# Patient Record
Sex: Female | Born: 1980 | Race: White | Hispanic: No | Marital: Married | Smoking: Never smoker
Health system: Southern US, Community
[De-identification: ages and names within clinical notes are randomized; demographics above are authoritative.]

## PROBLEM LIST (undated history)

## (undated) DIAGNOSIS — C539 Malignant neoplasm of cervix uteri, unspecified: Secondary | ICD-10-CM

## (undated) DIAGNOSIS — F32A Depression, unspecified: Secondary | ICD-10-CM

## (undated) DIAGNOSIS — R51 Headache: Secondary | ICD-10-CM

## (undated) DIAGNOSIS — F329 Major depressive disorder, single episode, unspecified: Secondary | ICD-10-CM

## (undated) DIAGNOSIS — K5904 Chronic idiopathic constipation: Secondary | ICD-10-CM

## (undated) DIAGNOSIS — O00109 Unspecified tubal pregnancy without intrauterine pregnancy: Secondary | ICD-10-CM

## (undated) DIAGNOSIS — C4490 Unspecified malignant neoplasm of skin, unspecified: Secondary | ICD-10-CM

## (undated) DIAGNOSIS — N200 Calculus of kidney: Secondary | ICD-10-CM

## (undated) DIAGNOSIS — R519 Headache, unspecified: Secondary | ICD-10-CM

## (undated) DIAGNOSIS — Z86018 Personal history of other benign neoplasm: Secondary | ICD-10-CM

## (undated) HISTORY — DX: Calculus of kidney: N20.0

## (undated) HISTORY — PX: SKIN CANCER EXCISION: SHX779

## (undated) HISTORY — DX: Major depressive disorder, single episode, unspecified: F32.9

## (undated) HISTORY — PX: TUBAL LIGATION: SHX77

## (undated) HISTORY — DX: Depression, unspecified: F32.A

## (undated) HISTORY — DX: Headache: R51

## (undated) HISTORY — DX: Chronic idiopathic constipation: K59.04

## (undated) HISTORY — DX: Unspecified tubal pregnancy without intrauterine pregnancy: O00.109

## (undated) HISTORY — DX: Unspecified malignant neoplasm of skin, unspecified: C44.90

## (undated) HISTORY — PX: OTHER SURGICAL HISTORY: SHX169

## (undated) HISTORY — DX: Headache, unspecified: R51.9

---
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
## 2005-10-01 ENCOUNTER — Ambulatory Visit: Payer: Self-pay | Admitting: Neurology

## 2006-03-19 ENCOUNTER — Ambulatory Visit: Payer: Self-pay

## 2006-03-20 ENCOUNTER — Observation Stay: Payer: Self-pay | Admitting: Obstetrics and Gynecology

## 2006-03-31 ENCOUNTER — Ambulatory Visit: Payer: Self-pay | Admitting: Obstetrics and Gynecology

## 2006-04-15 ENCOUNTER — Observation Stay: Payer: Self-pay

## 2006-04-16 ENCOUNTER — Observation Stay: Payer: Self-pay | Admitting: Obstetrics and Gynecology

## 2006-04-16 ENCOUNTER — Ambulatory Visit: Payer: Self-pay | Admitting: Obstetrics and Gynecology

## 2006-04-18 ENCOUNTER — Observation Stay: Payer: Self-pay | Admitting: Obstetrics and Gynecology

## 2006-04-30 ENCOUNTER — Inpatient Hospital Stay: Payer: Self-pay

## 2006-05-05 ENCOUNTER — Inpatient Hospital Stay: Payer: Self-pay

## 2006-11-29 ENCOUNTER — Emergency Department: Payer: Self-pay | Admitting: Emergency Medicine

## 2006-12-01 ENCOUNTER — Ambulatory Visit: Payer: Self-pay | Admitting: Gastroenterology

## 2008-11-29 ENCOUNTER — Ambulatory Visit: Payer: Self-pay

## 2009-06-05 ENCOUNTER — Inpatient Hospital Stay: Payer: Self-pay | Admitting: Internal Medicine

## 2009-06-05 ENCOUNTER — Ambulatory Visit: Payer: Self-pay | Admitting: Internal Medicine

## 2009-06-07 ENCOUNTER — Inpatient Hospital Stay: Payer: Self-pay | Admitting: Psychiatry

## 2010-04-08 ENCOUNTER — Other Ambulatory Visit: Payer: Self-pay

## 2010-04-09 ENCOUNTER — Ambulatory Visit: Payer: Self-pay | Admitting: Obstetrics and Gynecology

## 2010-04-14 LAB — PATHOLOGY REPORT

## 2010-05-11 DIAGNOSIS — C539 Malignant neoplasm of cervix uteri, unspecified: Secondary | ICD-10-CM

## 2010-05-11 HISTORY — DX: Malignant neoplasm of cervix uteri, unspecified: C53.9

## 2012-12-21 ENCOUNTER — Emergency Department: Payer: Self-pay | Admitting: Emergency Medicine

## 2012-12-21 LAB — URINALYSIS, COMPLETE
Bacteria: NONE SEEN
Glucose,UR: NEGATIVE mg/dL (ref 0–75)
Leukocyte Esterase: NEGATIVE
Nitrite: NEGATIVE
Protein: NEGATIVE
RBC,UR: 2 /HPF (ref 0–5)
Squamous Epithelial: 5
WBC UR: 1 /HPF (ref 0–5)

## 2013-01-17 ENCOUNTER — Ambulatory Visit: Payer: Self-pay | Admitting: Family Medicine

## 2013-05-11 HISTORY — PX: AUGMENTATION MAMMAPLASTY: SUR837

## 2013-06-19 ENCOUNTER — Emergency Department: Payer: Self-pay | Admitting: Emergency Medicine

## 2013-06-19 LAB — URINALYSIS, COMPLETE
Bacteria: NONE SEEN
Bilirubin,UR: NEGATIVE
GLUCOSE, UR: NEGATIVE mg/dL (ref 0–75)
Ketone: NEGATIVE
Leukocyte Esterase: NEGATIVE
Nitrite: NEGATIVE
Ph: 6 (ref 4.5–8.0)
Protein: NEGATIVE
RBC,UR: NONE SEEN /HPF (ref 0–5)
Specific Gravity: 1.003 (ref 1.003–1.030)
WBC UR: NONE SEEN /HPF (ref 0–5)

## 2013-12-19 DIAGNOSIS — Z86018 Personal history of other benign neoplasm: Secondary | ICD-10-CM

## 2013-12-19 HISTORY — DX: Personal history of other benign neoplasm: Z86.018

## 2014-07-19 LAB — HM PAP SMEAR: HM Pap smear: NEGATIVE

## 2014-11-01 ENCOUNTER — Encounter: Payer: Self-pay | Admitting: Family Medicine

## 2014-11-01 ENCOUNTER — Ambulatory Visit (INDEPENDENT_AMBULATORY_CARE_PROVIDER_SITE_OTHER): Payer: Managed Care, Other (non HMO) | Admitting: Family Medicine

## 2014-11-01 VITALS — BP 131/96 | HR 71 | Temp 97.8°F | Ht 66.0 in | Wt 121.6 lb

## 2014-11-01 DIAGNOSIS — R42 Dizziness and giddiness: Secondary | ICD-10-CM | POA: Diagnosis not present

## 2014-11-01 DIAGNOSIS — R58 Hemorrhage, not elsewhere classified: Secondary | ICD-10-CM | POA: Diagnosis not present

## 2014-11-01 DIAGNOSIS — R634 Abnormal weight loss: Secondary | ICD-10-CM

## 2014-11-01 LAB — CBC WITH DIFFERENTIAL/PLATELET
HEMATOCRIT: 42 % (ref 34.0–46.6)
Hemoglobin: 14.8 g/dL (ref 11.1–15.9)
LYMPHS ABS: 1.7 10*3/uL (ref 0.7–3.1)
Lymphs: 26 %
MCH: 31.5 pg (ref 26.6–33.0)
MCHC: 35.2 g/dL (ref 31.5–35.7)
MCV: 89 fL (ref 79–97)
MID (Absolute): 0.6 10*3/uL (ref 0.1–1.6)
MID: 9 %
Neutrophils Absolute: 4.1 10*3/uL (ref 1.4–7.0)
Neutrophils: 65 %
Platelets: 296 10*3/uL (ref 150–379)
RBC: 4.7 x10E6/uL (ref 3.77–5.28)
RDW: 13.2 % (ref 12.3–15.4)
WBC: 6.4 10*3/uL (ref 3.4–10.8)

## 2014-11-01 NOTE — Assessment & Plan Note (Signed)
Concerning for possible hyperthyroid vs anxiety. Continue to monitor. Await lab results.

## 2014-11-01 NOTE — Progress Notes (Signed)
BP 131/96 mmHg  Pulse 71  Temp(Src) 97.8 F (36.6 C)  Ht 5\' 6"  (1.676 m)  Wt 121 lb 9.6 oz (55.157 kg)  BMI 19.64 kg/m2  SpO2 100%  LMP 10/22/2014 (Exact Date)   Subjective:    Patient ID: Regina [name], female    DOB: 07/21/1980, 34 y.o.   MRN: [id number]  HPI: Regina [name] is a 34 y.o. female who presents today not feeling like herself. She notes that she is just not feeling well and isn't sure what is going on.   Chief Complaint  Patient presents with  . Dizziness    Started on Sunday morning  . Headache    Started on Saturday, not a headache but stabbing pains  . Menstrual Problem    Has been on her cycle since the 13th  . Rectal Bleeding    Has been going on a couple of months   Sunday bad headache jabbing in the L side of her head. Has never had headaches on the L side previously. Has a history of migraines, but they don't feel like her normal migraine. Had her blood pressure taken for work and it was 118/40 on Monday and yesterday it was 150/80  DIZZINESS Duration: days Description of symptoms: lightheaded and room spinning with a lot of pressure in her head and exhausted Duration of episode: constant Dizziness frequency: no history of the same Provoking factors: none Aggravating factors:  none Triggered by rolling over in bed: no Triggered by bending over: no Aggravated by head movement: no Aggravated by exertion, coughing, loud noises: no Recent head injury: no Recent or current viral symptoms: no History of vasovagal episodes: no Nausea: yes Vomiting: no Tinnitus: no Hearing loss: no Aural fullness: no Headache: yes Photophobia/phonophobia: no Unsteady gait: no Postural instability: yes Diplopia, dysarthria, dysphagia or weakness: no Related to exertion: no Pallor: no Diaphoresis: no Dyspnea: no Chest pain: no   Has lost 27 pounds, 10lbs without effort. Has been under a lot of stress. Has also been losing hair.   ABNORMAL MENSTRUAL  PERIODS- had an IUD placed in March, has been light, has been 10 days on her period, no chance she could be pregnant Duration: 10 days Average interval between menses: 28-31 days Length of menses:  Flow: light Dysmenorrhea: yes Intermenstrual bleeding:no Postcoital bleeding: no Contraception: tubal ligation and IUD Sexual activity: In a Monogamous Relationship History of sexually transmitted diseases: no History GYN procedures: yes Abnormal pap smears: no   Dyspareunia: no Vaginal discharge:no Abdominal pain: no Galactorrhea: no Hirsuitism: no Frequent bruising/mucosal bleeding: no Double vision:no Hot flashes: no   She also notes that her stools have been very hard since she stopped her linzess and she has been having some rectal bleeding. Has not been using any stool softeners.   Relevant past medical, surgical, family and social history reviewed and updated as indicated. Interim medical history since our last visit reviewed. Allergies and medications reviewed and updated.  Review of Systems  Constitutional: Positive for fatigue and unexpected weight change. Negative for fever, chills, diaphoresis, activity change and appetite change.  Respiratory: Negative for apnea, cough, choking, chest tightness, shortness of breath, wheezing and stridor.   Cardiovascular: Negative for chest pain, palpitations and leg swelling.  Gastrointestinal: Positive for constipation and anal bleeding. Negative for nausea, vomiting, abdominal pain, diarrhea, blood in stool, abdominal distention and rectal pain.  Psychiatric/Behavioral: Positive for sleep disturbance and decreased concentration. Negative for suicidal ideas, hallucinations, behavioral problems, confusion, self-injury, dysphoric mood  and agitation. The patient is nervous/anxious. The patient is not hyperactive.     Per HPI unless specifically indicated above     Objective:    BP 131/96 mmHg  Pulse 71  Temp(Src) 97.8 F (36.6 C)  Ht  5\' 6"  (1.676 m)  Wt 121 lb 9.6 oz (55.157 kg)  BMI 19.64 kg/m2  SpO2 100%  LMP 10/22/2014 (Exact Date)  Wt Readings from Last 3 Encounters:  11/01/14 121 lb 9.6 oz (55.157 kg)  03/01/14 141 lb (63.957 kg)    Physical Exam  Constitutional: She is oriented to person, place, and time. She appears well-developed and well-nourished. No distress.  HENT:  Head: Normocephalic and atraumatic.  Right Ear: Hearing and external ear normal.  Left Ear: Hearing and external ear normal.  Nose: Nose normal.  Mouth/Throat: Oropharynx is clear and moist. No oropharyngeal exudate.  Eyes: Conjunctivae, EOM and lids are normal. Pupils are equal, round, and reactive to light. Right eye exhibits no discharge. Left eye exhibits no discharge. No scleral icterus.  No nystagmus  Neck: Normal range of motion. Neck supple. No JVD present. No tracheal deviation present. No thyromegaly present.  Cardiovascular: Normal rate, regular rhythm and normal heart sounds.  Exam reveals no gallop and no friction rub.   No murmur heard. Pulmonary/Chest: Effort normal. No stridor. No respiratory distress. She has no wheezes. She has no rales. She exhibits no tenderness.  Abdominal: Soft. Bowel sounds are normal. She exhibits no distension and no mass. There is no tenderness. There is no rebound and no guarding.  Musculoskeletal: Normal range of motion.  Neurological: She is alert and oriented to person, place, and time.  Skin: Skin is warm, dry and intact. No rash noted. No erythema. No pallor.  Psychiatric: She has a normal mood and affect. Her speech is normal and behavior is normal. Judgment and thought content normal. Cognition and memory are normal.      Assessment & Plan:   Problem List Items Addressed This Visit      Other   Dizziness - Primary    Of unclear etiology at this time. Possibly viral. CBC normal. Checking TSH and CMP. Possibly anxiety related. Concerning for possible hyperthyroid with weight loss and hair  loss. Possibly due to complex migraine. Await results. Increase fluid intake and electrolyte intake. Get plenty of rest. Follow up 1 week to see if she is feeling better.       Relevant Orders   CBC With Differential/Platelet (Completed)   Comprehensive metabolic panel   TSH   Bleeding    Abnormal menses likely due to new IUD. Not anemic at this time. Rectal bleeding likely due to constipation. Will start on stool softener and continue to monitor.       Relevant Orders   CBC With Differential/Platelet (Completed)   Comprehensive metabolic panel   TSH   Weight loss, unintentional    Concerning for possible hyperthyroid vs anxiety. Continue to monitor. Await lab results.           Follow up plan: Return in about 1 week (around 11/08/2014).

## 2014-11-01 NOTE — Patient Instructions (Signed)

## 2014-11-01 NOTE — Assessment & Plan Note (Addendum)
Abnormal menses likely due to new IUD. Not anemic at this time. Rectal bleeding likely due to constipation. Will start on stool softener and continue to monitor.

## 2014-11-01 NOTE — Assessment & Plan Note (Addendum)
Of unclear etiology at this time. Possibly viral. CBC normal. Checking TSH and CMP. Possibly anxiety related. Concerning for possible hyperthyroid with weight loss and hair loss. Possibly due to complex migraine. Await results. Increase fluid intake and electrolyte intake. Get plenty of rest. Follow up 1 week to see if she is feeling better.

## 2014-11-02 ENCOUNTER — Telehealth: Payer: Self-pay

## 2014-11-02 LAB — COMPREHENSIVE METABOLIC PANEL
A/G RATIO: 2.2 (ref 1.1–2.5)
ALK PHOS: 67 IU/L (ref 39–117)
ALT: 11 IU/L (ref 0–32)
AST: 12 IU/L (ref 0–40)
Albumin: 4.6 g/dL (ref 3.5–5.5)
BUN / CREAT RATIO: 8 (ref 8–20)
BUN: 7 mg/dL (ref 6–20)
Bilirubin Total: 0.5 mg/dL (ref 0.0–1.2)
CHLORIDE: 101 mmol/L (ref 97–108)
CO2: 24 mmol/L (ref 18–29)
Calcium: 9.5 mg/dL (ref 8.7–10.2)
Creatinine, Ser: 0.85 mg/dL (ref 0.57–1.00)
GFR calc Af Amer: 103 mL/min/{1.73_m2} (ref >59)
GFR calc non Af Amer: 90 mL/min/{1.73_m2} (ref >59)
GLOBULIN, TOTAL: 2.1 g/dL (ref 1.5–4.5)
Glucose: 94 mg/dL (ref 65–99)
Potassium: 5.1 mmol/L (ref 3.5–5.2)
Sodium: 140 mmol/L (ref 134–144)
TOTAL PROTEIN: 6.7 g/dL (ref 6.0–8.5)

## 2014-11-02 LAB — TSH: TSH: 1.18 u[IU]/mL (ref 0.450–4.500)

## 2014-11-02 MED ORDER — PREDNISONE 10 MG PO TABS: ORAL_TABLET | ORAL | Status: DC

## 2014-11-02 NOTE — Telephone Encounter (Signed)
Called patient with her results from yesterday. She is feeling worse today with more pressure in her head. Will treat like nasal congestion and do 4 day burst of prednisone, sent to her pharmacy. Will check in with her Monday to see how she is feeling.

## 2014-11-02 NOTE — Telephone Encounter (Signed)
Patient called, she would like the results of her labs from yesterday.

## 2014-11-05 ENCOUNTER — Telehealth: Payer: Self-pay | Admitting: Family Medicine

## 2014-11-05 NOTE — Telephone Encounter (Signed)
Called patient to check in to see how she is doing. She states that she hasn't even picked up the prednisone. She is feeling much better. She will call if doing worse again.

## 2014-11-05 NOTE — Telephone Encounter (Signed)
-----   Message from Valerie Roys, DO sent at 11/02/2014  5:18 PM EDT ----- Check in with patient on Monday

## 2014-11-07 ENCOUNTER — Ambulatory Visit: Payer: Managed Care, Other (non HMO) | Admitting: Family Medicine

## 2015-02-15 ENCOUNTER — Telehealth: Payer: Self-pay | Admitting: Obstetrics and Gynecology

## 2015-02-15 NOTE — Telephone Encounter (Signed)
PT WANTED TO KNOW IF [facility] COULD SWITCH MEDICATION. SHE WAS PERSCRIBED  WELBUTRON XL AND SHE IS HAVING SOME SIDE EFFECTS ON IT SINCE MARCH AND SHE WANTED TO KNOW IF YOU COULD SWITCH IT.

## 2015-02-15 NOTE — Telephone Encounter (Signed)
Called pt she is going to set up appt with [facility]

## 2015-02-15 NOTE — Telephone Encounter (Signed)
Let her know it is possible, but I want to talk to her about it futher at next appt- I believe it is this month.

## 2015-02-20 ENCOUNTER — Ambulatory Visit (INDEPENDENT_AMBULATORY_CARE_PROVIDER_SITE_OTHER): Payer: Managed Care, Other (non HMO) | Admitting: Obstetrics and Gynecology

## 2015-02-20 ENCOUNTER — Encounter: Payer: Self-pay | Admitting: Obstetrics and Gynecology

## 2015-02-20 VITALS — BP 137/84 | HR 98 | Ht 67.0 in | Wt 131.8 lb

## 2015-02-20 DIAGNOSIS — R41840 Attention and concentration deficit: Secondary | ICD-10-CM

## 2015-02-20 MED ORDER — SERTRALINE HCL 25 MG PO TABS: 25.0000 mg | ORAL_TABLET | Freq: Every day | ORAL | Status: DC

## 2015-02-20 NOTE — Progress Notes (Signed)
Subjective:     Patient ID: Regina [name], female   DOB: 24-Nov-1980, 34 y.o.   MRN: [id number]  HPI Reports difficulty focusing and doing poorly in school x 1 month with obssessing on relationship issue, concerned that current SNRI or Mirena may be causing it. 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 Has been going to group therapy  Review of Systems See above    Objective:   Physical Exam A&O x4 Well groomed female in no distress Able to articulate and has appropriate thought processes    Assessment:     Difficulty concentrating Anxiety and mild depression     Plan:     Will add zoloft 25mg  at bedtime, and she will let me know how she feels in 4 weeks. If no improvement will change her back to regular wellbutrin bid as requested.  Encouraged returning to individualized counseling and removing some of her stressors, learning to say 'No" and not over extend herself.  Regina [name] Trudee Kuster, CNM

## 2015-02-25 ENCOUNTER — Ambulatory Visit: Payer: Managed Care, Other (non HMO) | Admitting: Family Medicine

## 2015-04-03 ENCOUNTER — Encounter: Payer: Self-pay | Admitting: Family Medicine

## 2015-05-14 DIAGNOSIS — R1031 Right lower quadrant pain: Secondary | ICD-10-CM | POA: Diagnosis not present

## 2015-05-14 DIAGNOSIS — M549 Dorsalgia, unspecified: Secondary | ICD-10-CM | POA: Diagnosis not present

## 2015-05-14 DIAGNOSIS — R112 Nausea with vomiting, unspecified: Secondary | ICD-10-CM | POA: Insufficient documentation

## 2015-05-14 DIAGNOSIS — Z880 Allergy status to penicillin: Secondary | ICD-10-CM | POA: Insufficient documentation

## 2015-05-14 DIAGNOSIS — R197 Diarrhea, unspecified: Secondary | ICD-10-CM | POA: Diagnosis not present

## 2015-05-14 DIAGNOSIS — Z79899 Other long term (current) drug therapy: Secondary | ICD-10-CM | POA: Insufficient documentation

## 2015-05-14 DIAGNOSIS — R109 Unspecified abdominal pain: Secondary | ICD-10-CM | POA: Diagnosis present

## 2015-05-14 DIAGNOSIS — Z3202 Encounter for pregnancy test, result negative: Secondary | ICD-10-CM | POA: Diagnosis not present

## 2015-05-14 MED ORDER — ONDANSETRON 4 MG PO TBDP
4.0000 mg | ORAL_TABLET | Freq: Once | ORAL | Status: AC | PRN
  Administered 2015-05-14: 4 mg via ORAL
  Filled 2015-05-14: qty 1

## 2015-05-14 NOTE — ED Notes (Signed)
Patient ambulatory to triage with steady gait, without difficulty or distress noted; pt reports right sided abd pain radiating into back since yesterday; st hx of kidney stones; st temp 101.5 at home 4hrs PTA but did not take any antipyretics; pt currently drinking but instructed not to drink anything else until examined; also reports diarrhea and nausea

## 2015-05-15 ENCOUNTER — Emergency Department: Payer: Managed Care, Other (non HMO)

## 2015-05-15 ENCOUNTER — Emergency Department
Admission: EM | Admit: 2015-05-15 | Discharge: 2015-05-15 | Disposition: A | Discharge status: Home / Self Care | Payer: Managed Care, Other (non HMO) | Attending: Emergency Medicine | Admitting: Emergency Medicine

## 2015-05-15 DIAGNOSIS — R1110 Vomiting, unspecified: Secondary | ICD-10-CM

## 2015-05-15 DIAGNOSIS — R197 Diarrhea, unspecified: Secondary | ICD-10-CM

## 2015-05-15 DIAGNOSIS — R1031 Right lower quadrant pain: Secondary | ICD-10-CM

## 2015-05-15 LAB — URINALYSIS COMPLETE WITH MICROSCOPIC (ARMC ONLY)
BACTERIA UA: NONE SEEN
BILIRUBIN URINE: NEGATIVE
Glucose, UA: NEGATIVE mg/dL
Ketones, ur: NEGATIVE mg/dL
LEUKOCYTES UA: NEGATIVE
Nitrite: NEGATIVE
PH: 7 (ref 5.0–8.0)
PROTEIN: NEGATIVE mg/dL
Specific Gravity, Urine: 1.003 — ABNORMAL LOW (ref 1.005–1.030)

## 2015-05-15 LAB — CBC
HEMATOCRIT: 40.2 % (ref 35.0–47.0)
Hemoglobin: 14 g/dL (ref 12.0–16.0)
MCH: 30.4 pg (ref 26.0–34.0)
MCHC: 34.7 g/dL (ref 32.0–36.0)
MCV: 87.7 fL (ref 80.0–100.0)
PLATELETS: 252 10*3/uL (ref 150–440)
RBC: 4.59 MIL/uL (ref 3.80–5.20)
RDW: 13.1 % (ref 11.5–14.5)
WBC: 5.5 10*3/uL (ref 3.6–11.0)

## 2015-05-15 LAB — POCT PREGNANCY, URINE: Preg Test, Ur: NEGATIVE

## 2015-05-15 LAB — COMPREHENSIVE METABOLIC PANEL
ALK PHOS: 67 U/L (ref 38–126)
ALT: 17 U/L (ref 14–54)
AST: 16 U/L (ref 15–41)
Albumin: 4.3 g/dL (ref 3.5–5.0)
Anion gap: 5 (ref 5–15)
BILIRUBIN TOTAL: 0.7 mg/dL (ref 0.3–1.2)
BUN: 9 mg/dL (ref 6–20)
CO2: 28 mmol/L (ref 22–32)
Calcium: 9 mg/dL (ref 8.9–10.3)
Chloride: 107 mmol/L (ref 101–111)
Creatinine, Ser: 0.86 mg/dL (ref 0.44–1.00)
GFR calc Af Amer: >60 mL/min (ref >60)
GFR calc non Af Amer: >60 mL/min (ref >60)
GLUCOSE: 101 mg/dL — AB (ref 65–99)
POTASSIUM: 3.8 mmol/L (ref 3.5–5.1)
SODIUM: 140 mmol/L (ref 135–145)
TOTAL PROTEIN: 7.2 g/dL (ref 6.5–8.1)

## 2015-05-15 LAB — LIPASE, BLOOD: Lipase: 25 U/L (ref 11–51)

## 2015-05-15 MED ORDER — TRAMADOL HCL 50 MG PO TABS: 50.0000 mg | ORAL_TABLET | Freq: Four times a day (QID) | ORAL | Status: DC | PRN

## 2015-05-15 MED ORDER — IOHEXOL 240 MG/ML SOLN
25.0000 mL | Freq: Once | INTRAMUSCULAR | Status: AC | PRN
Start: 2015-05-15 — End: 2015-05-15
  Administered 2015-05-15: 25 mL via ORAL

## 2015-05-15 MED ORDER — IOHEXOL 300 MG/ML  SOLN
100.0000 mL | Freq: Once | INTRAMUSCULAR | Status: AC | PRN
  Administered 2015-05-15: 100 mL via INTRAVENOUS

## 2015-05-15 MED ORDER — MORPHINE SULFATE (PF) 4 MG/ML IV SOLN
4.0000 mg | Freq: Once | INTRAVENOUS | Status: AC
  Administered 2015-05-15: 4 mg via INTRAVENOUS

## 2015-05-15 MED ORDER — ONDANSETRON HCL 4 MG/2ML IJ SOLN
4.0000 mg | Freq: Once | INTRAMUSCULAR | Status: AC
  Administered 2015-05-15: 4 mg via INTRAVENOUS

## 2015-05-15 MED ORDER — SODIUM CHLORIDE 0.9 % IV BOLUS (SEPSIS)
1000.0000 mL | Freq: Once | INTRAVENOUS | Status: AC
  Administered 2015-05-15: 1000 mL via INTRAVENOUS

## 2015-05-15 MED ORDER — MORPHINE SULFATE (PF) 4 MG/ML IV SOLN
INTRAVENOUS | Status: AC
  Administered 2015-05-15: 4 mg via INTRAVENOUS
  Filled 2015-05-15: qty 1

## 2015-05-15 MED ORDER — ONDANSETRON 4 MG PO TBDP: 4.0000 mg | ORAL_TABLET | Freq: Three times a day (TID) | ORAL | Status: DC | PRN

## 2015-05-15 MED ORDER — ONDANSETRON HCL 4 MG/2ML IJ SOLN
INTRAMUSCULAR | Status: AC
  Administered 2015-05-15: 4 mg via INTRAVENOUS
  Filled 2015-05-15: qty 2

## 2015-05-15 NOTE — ED Provider Notes (Signed)
[facility] Emergency Department Provider Note  ____________________________________________  Time seen: Approximately [id number] AM  I have reviewed the triage vital signs and the nursing notes.   HISTORY  Chief Complaint Abdominal Pain    HPI Regina [name] is a 35 y.o. female who comes into the hospital today with nausea and abdominal pain. The patient reports that she woke up nauseous yesterday morning. She reports that she has been having gas spasm pains throughout the day. She reports that her right side of her abdomen is very tender to touch. She reports that the pain started in her mid abdomen moved to the right lower quadrant. She reports around 10 PM yesterday she had a temperature to 101.5 but did not take any medication for it. The patient is having dry heaves and is unable to eat much all day. She had a biscuit and some soup reports that she had diarrhea with it. The patient had 2 episodes of diarrhea but normally has a history of constipation. The patient denies any sick contacts, vaginal discharge or bleeding. The patient has an IUD and rates her pain a 7 out of 10 in intensity. The patient came in just cut she was unable to tolerate the pain anymore.   Past Medical History  Diagnosis Date  . Depression   . Headache     migraines  . Kidney stone   . Chronic idiopathic constipation   . Tubal pregnancy   . Cancer [facility])     cervical cancer    Patient Active Problem List   Diagnosis Date Noted  . Dizziness 11/01/2014  . Bleeding 11/01/2014  . Weight loss, unintentional 11/01/2014    Past Surgical History  Procedure Laterality Date  . 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 Tubal ligation      Current Outpatient Rx  Name  Route  Sig  Dispense  Refill  . buPROPion (WELLBUTRIN XL) 150 MG 24 hr tablet      TK 1 T PO QD      7   . levonorgestrel (MIRENA) 20 MCG/24HR IUD   Intrauterine   1 each by Intrauterine route once.         . ondansetron (ZOFRAN ODT) 4 MG disintegrating tablet   Oral   Take 1 tablet (4 mg total) by mouth every 8 (eight) hours as needed for nausea or vomiting.   20 tablet   0   . predniSONE (DELTASONE) 10 MG tablet      Take 3 tabs all at once daily for 4 days Patient not taking: Reported on 02/20/2015   12 tablet   0   . sertraline (ZOLOFT) 25 MG tablet   Oral   Take 1 tablet (25 mg total) by mouth daily.   30 tablet   6   . traMADol (ULTRAM) 50 MG tablet   Oral   Take 1 tablet (50 mg total) by mouth every 6 (six) hours as needed.   12 tablet   0     Allergies Penicillins  Family History  Problem Relation Age of Onset  . Diabetes Mother   . Hypertension Mother   . Cancer Father     colon  . Diabetes Father   . Hypertension Father   . Migraines Father   . Diabetes Maternal Grandfather     Social History Social History  Substance Use Topics  . Smoking status: Never Smoker   . Smokeless tobacco: Never Used  .  Alcohol Use: Yes     Comment: on occasion    Review of Systems Constitutional:  fever/chills Eyes: No visual changes. ENT: No sore throat. Cardiovascular: Denies chest pain. Respiratory: Denies shortness of breath. Gastrointestinal: Abdominal pain, nausea, vomiting, diarrhea Genitourinary: Negative for dysuria. Musculoskeletal:  back pain. Skin: Negative for rash. Neurological: Negative for headaches, focal weakness or numbness.  10-point ROS otherwise negative.  ____________________________________________   PHYSICAL EXAM:  VITAL SIGNS: ED Triage Vitals  Enc Vitals Group     BP 05/14/15 2351 148/95 mmHg     Pulse Rate 05/14/15 2351 85     Resp 05/14/15 2351 20     Temp 05/14/15 2351 97.8 F (36.6 C)     Temp Source 05/14/15 2351 Oral     SpO2 05/14/15 2351 100 %     Weight 05/14/15 2351 131 lb (59.421 kg)     Height 05/14/15 2351 5\' 7"  (1.702 m)     Head Cir --      Peak Flow --      Pain Score 05/14/15 2351 8     Pain Loc --       Pain Edu? --      Excl. in [place]? --     Constitutional: Alert and oriented. Well appearing and in moderate distress. Eyes: Conjunctivae are normal. PERRL. EOMI. Head: Atraumatic. Nose: No congestion/rhinnorhea. Mouth/Throat: Mucous membranes are moist.  Oropharynx non-erythematous. Cardiovascular: Normal rate, regular rhythm. Grossly normal heart sounds.  Good peripheral circulation. Respiratory: Normal respiratory effort.  No retractions. Lungs CTAB. Gastrointestinal: Soft with right-sided tenderness to palpation. No distention. Positive bowel sounds Musculoskeletal: No lower extremity tenderness nor edema.   Neurologic:  Normal speech and language.  Skin:  Skin is warm, dry and intact.  Psychiatric: Mood and affect are normal.   ____________________________________________   LABS (all labs ordered are listed, but only abnormal results are displayed)  Labs Reviewed  COMPREHENSIVE METABOLIC PANEL - Abnormal; Notable for the following:    Glucose, Bld 101 (*)    All other components within normal limits  URINALYSIS COMPLETEWITH MICROSCOPIC ([facility] ONLY) - Abnormal; Notable for the following:    Color, Urine COLORLESS (*)    APPearance CLEAR (*)    Specific Gravity, Urine 1.003 (*)    Hgb urine dipstick 1+ (*)    Squamous Epithelial / LPF 0-5 (*)    All other components within normal limits  LIPASE, BLOOD  CBC  POC URINE PREG, ED  POCT PREGNANCY, URINE   ____________________________________________  EKG  none ____________________________________________  RADIOLOGY  CT abdomen and pelvis: NO acute abnormality seen within the abdomen or pelvis ____________________________________________   PROCEDURES  Procedure(s) performed: None  Critical Care performed: No  ____________________________________________   INITIAL IMPRESSION / ASSESSMENT AND PLAN / ED COURSE  Pertinent labs & imaging results that were available during my care of the patient were reviewed by me  and considered in my medical decision making (see chart for details).  This is a 35 year old female who comes in to the hospital today with right lower quadrant abdominal pain. Given the patient's symptoms I will do a CT scan of her abdomen. The patient will receive a dose of morphine and Zofran as well as a liter of normal saline.  The patient reports that her pain is improved and I explained to her that her CT does not show any acute appendicitis. I will discharge the patient home with some Zofran and encouraged her to take some by mouth hydration. The  patient will be discharged to home and will follow up with her primary care physician. ____________________________________________   FINAL CLINICAL IMPRESSION(S) / ED DIAGNOSES  Final diagnoses:  Right lower quadrant abdominal pain  Vomiting and diarrhea      Loney Hering, MD 05/15/15 [phone]

## 2015-05-15 NOTE — ED Notes (Signed)
Pt reports RUQ abdominal pain x 2 days with nausea no vomiting. Pt in no acute distress

## 2015-05-15 NOTE — Discharge Instructions (Signed)
Abdominal Pain, Adult °Many things can cause abdominal pain. Usually, abdominal pain is not caused by a disease and will improve without treatment. It can often be observed and treated at home. Your health care provider will do a physical exam and possibly order blood tests and X-rays to help determine the seriousness of your pain. However, in many cases, more time must pass before a clear cause of the pain can be found. Before that point, your health care provider may not know if you need more testing or further treatment. °HOME CARE INSTRUCTIONS °Monitor your abdominal pain for any changes. The following actions may help to alleviate any discomfort you are experiencing: °· Only take over-the-counter or prescription medicines as directed by your health care provider. °· Do not take laxatives unless directed to do so by your health care provider. °· Try a clear liquid diet (broth, tea, or water) as directed by your health care provider. Slowly move to a bland diet as tolerated. °SEEK MEDICAL CARE IF: °· You have unexplained abdominal pain. °· You have abdominal pain associated with nausea or diarrhea. °· You have pain when you urinate or have a bowel movement. °· You experience abdominal pain that wakes you in the night. °· You have abdominal pain that is worsened or improved by eating food. °· You have abdominal pain that is worsened with eating fatty foods. °· You have a fever. °SEEK IMMEDIATE MEDICAL CARE IF: °· Your pain does not go away within 2 hours. °· You keep throwing up (vomiting). °· Your pain is felt only in portions of the abdomen, such as the right side or the left lower portion of the abdomen. °· You pass bloody or black tarry stools. °MAKE SURE YOU: °· Understand these instructions. °· Will watch your condition. °· Will get help right away if you are not doing well or get worse. °  °This information is not intended to replace advice given to you by your health care provider. Make sure you discuss  any questions you have with your health care provider. °  °Document Released: 02/04/2005 Document Revised: 01/16/2015 Document Reviewed: 01/04/2013 °Elsevier Interactive Patient Education ©2016 Else[employer] Inc[employer] °Nausea and Vomiting °Nausea is a sick feeling that often comes before throwing up (vomiting). Vomiting is a reflex where stomach contents come out of your mouth. Vomiting can cause severe loss of body fluids (dehydration). Children and elderly adults can become dehydrated quickly, especially if they also have diarrhea. Nausea and vomiting are symptoms of a condition or disease. It is important to find the cause of your symptoms. °CAUSES  °· Direct irritation of the stomach lining. This irritation can result from increased acid production (gastroesophageal reflux disease), infection, food poisoning, taking certain medicines (such as nonsteroidal anti-inflammatory drugs), alcohol use, or tobacco use. °· Signals from the brain. These signals could be caused by a headache, heat exposure, an inner ear disturbance, increased pressure in the brain from injury, infection, a tumor, or a concussion, pain, emotional stimulus, or metabolic problems. °· An obstruction in the gastrointestinal tract (bowel obstruction). °· Illnesses such as diabetes, hepatitis, gallbladder problems, appendicitis, kidney problems, cancer, sepsis, atypical symptoms of a heart attack, or eating disorders. °· Medical treatments such as chemotherapy and radiation. °· Receiving medicine that makes you sleep (general anesthetic) during surgery. °DIAGNOSIS °Your caregiver may ask for tests to be done if the problems do not improve after a few days. Tests may also be done if symptoms are severe or if the reason for the nausea   and vomiting is not clear. Tests may include: °· Urine tests. °· Blood tests. °· Stool tests. °· Cultures (to look for evidence of infection). °· X-rays or other imaging studies. °Test results can help your caregiver make  decisions about treatment or the need for additional tests. °TREATMENT °You need to stay well hydrated. Drink frequently but in small amounts. You may wish to drink water, sports drinks, clear broth, or eat frozen ice pops or gelatin dessert to help stay hydrated. When you eat, eating slowly may help prevent nausea. There are also some antinausea medicines that may help prevent nausea. °HOME CARE INSTRUCTIONS  °· Take all medicine as directed by your caregiver. °· If you do not have an appetite, do not force yourself to eat. However, you must continue to drink fluids. °· If you have an appetite, eat a normal diet unless your caregiver tells you differently. °· Eat a variety of complex carbohydrates (rice, wheat, potatoes, bread), lean meats, yogurt, fruits, and vegetables. °· Avoid high-fat foods because they are more difficult to digest. °· Drink enough water and fluids to keep your urine clear or pale yellow. °· If you are dehydrated, ask your caregiver for specific rehydration instructions. Signs of dehydration may include: °· Severe thirst. °· Dry lips and mouth. °· Dizziness. °· Dark urine. °· Decreasing urine frequency and amount. °· Confusion. °· Rapid breathing or pulse. °SEEK IMMEDIATE MEDICAL CARE IF:  °· You have blood or brown flecks (like coffee grounds) in your vomit. °· You have black or bloody stools. °· You have a severe headache or stiff neck. °· You are confused. °· You have severe abdominal pain. °· You have chest pain or trouble breathing. °· You do not urinate at least once every 8 hours. °· You develop cold or clammy skin. °· You continue to vomit for longer than 24 to 48 hours. °· You have a fever. °MAKE SURE YOU:  °· Understand these instructions. °· Will watch your condition. °· Will get help right away if you are not doing well or get worse. °  °This information is not intended to replace advice given to you by your health care provider. Make sure you discuss any questions you have with  your health care provider. °  °Document Released: 04/27/2005 Document Revised: 07/20/2011 Document Reviewed: 09/24/2010 °Elsevier Interactive Patient Education ©2016 Elsevier Inc[employer] °Diarrhea °Diarrhea is frequent loose and watery bowel movements. It can cause you to feel weak and dehydrated. Dehydration can cause you to become tired and thirsty, have a dry mouth, and have decreased urination that often is dark yellow. Diarrhea is a sign of another problem, most often an infection that will not last long. In most cases, diarrhea typically lasts 2-3 days. However, it can last longer if it is a sign of something more serious. It is important to treat your diarrhea as directed by your caregiver to lessen or prevent future episodes of diarrhea. °CAUSES  °Some common causes include: °· Gastrointestinal infections caused by viruses, bacteria, or parasites. °· Food poisoning or food allergies. °· Certain medicines, such as antibiotics, chemotherapy, and laxatives. °· Artificial sweeteners and fructose. °· Digestive disorders. °HOME CARE INSTRUCTIONS °· Ensure adequate fluid intake (hydration): Have 1 cup (8 oz) of fluid for each diarrhea episode. Avoid fluids that contain simple sugars or sports drinks, fruit juices, whole milk products, and sodas. Your urine should be clear or pale yellow if you are drinking enough fluids. Hydrate with an oral rehydration solution that you can purchase at pharmacies, retail   stores, and online. You can prepare an oral rehydration solution at home by mixing the following ingredients together: °¨  - tsp table salt. °¨ ¾ tsp baking soda. °¨  tsp salt substitute containing potassium chloride. °¨ 1  tablespoons sugar. °¨ 1 L (34 oz) of water. °· Certain foods and beverages may increase the speed at which food moves through the gastrointestinal (GI) tract. These foods and beverages should be avoided and include: °¨ Caffeinated and alcoholic beverages. °¨ High-fiber foods, such as raw fruits and  vegetables, nuts, seeds, and whole grain breads and cereals. °¨ Foods and beverages sweetened with sugar alcohols, such as xylitol, sorbitol, and mannitol. °· Some foods may be well tolerated and may help thicken stool including: °¨ Starchy foods, such as rice, toast, pasta, low-sugar cereal, oatmeal, grits, baked potatoes, crackers, and bagels. °¨ Bananas. °¨ Applesauce. °· Add probiotic-rich foods to help increase healthy bacteria in the GI tract, such as yogurt and fermented milk products. °· Wash your hands well after each diarrhea episode. °· Only take over-the-counter or prescription medicines as directed by your caregiver. °· Take a warm bath to relieve any burning or pain from frequent diarrhea episodes. °SEEK IMMEDIATE MEDICAL CARE IF:  °· You are unable to keep fluids down. °· You have persistent vomiting. °· You have blood in your stool, or your stools are black and tarry. °· You do not urinate in 6-8 hours, or there is only a small amount of very dark urine. °· You have abdominal pain that increases or localizes. °· You have weakness, dizziness, confusion, or light-headedness. °· You have a severe headache. °· Your diarrhea gets worse or does not get better. °· You have a fever or persistent symptoms for more than 2-3 days. °· You have a fever and your symptoms suddenly get worse. °MAKE SURE YOU:  °· Understand these instructions. °· Will watch your condition. °· Will get help right away if you are not doing well or get worse. °  °This information is not intended to replace advice given to you by your health care provider. Make sure you discuss any questions you have with your health care provider. °  °Document Released: 04/17/2002 Document Revised: 05/18/2014 Document Reviewed: 01/03/2012 °Elsevier Interactive Patient Education ©2016 Elsevier Inc. ° ° °

## 2015-07-26 ENCOUNTER — Encounter: Payer: Self-pay | Admitting: Obstetrics and Gynecology

## 2015-09-04 ENCOUNTER — Ambulatory Visit (INDEPENDENT_AMBULATORY_CARE_PROVIDER_SITE_OTHER): Payer: Managed Care, Other (non HMO) | Admitting: Obstetrics and Gynecology

## 2015-09-04 ENCOUNTER — Encounter: Payer: Self-pay | Admitting: Obstetrics and Gynecology

## 2015-09-04 VITALS — BP 131/84 | HR 80 | Ht 67.0 in | Wt 142.2 lb

## 2015-09-04 DIAGNOSIS — Z01419 Encounter for gynecological examination (general) (routine) without abnormal findings: Secondary | ICD-10-CM

## 2015-09-04 DIAGNOSIS — F329 Major depressive disorder, single episode, unspecified: Secondary | ICD-10-CM

## 2015-09-04 DIAGNOSIS — Z30431 Encounter for routine checking of intrauterine contraceptive device: Secondary | ICD-10-CM | POA: Diagnosis not present

## 2015-09-04 DIAGNOSIS — F32A Depression, unspecified: Secondary | ICD-10-CM

## 2015-09-04 MED ORDER — BUPROPION HCL 75 MG PO TABS: 75.0000 mg | ORAL_TABLET | Freq: Two times a day (BID) | ORAL | Status: DC

## 2015-09-04 NOTE — Progress Notes (Signed)
Subjective:   Regina [name] is a 35 y.o. No obstetric history on file. Caucasian female here for a routine well-woman exam.  No LMP recorded. Patient is not currently having periods (Reason: IUD).    Current complaints: none PCP: me       doesn't desire labs  Social History: Sexual: heterosexual Marital Status: divorced Living situation: alone Occupation: unknown occupation Tobacco/alcohol: no tobacco use Illicit drugs: no history of illicit drug use  The following portions of the patient's history were reviewed and updated as appropriate: allergies, current medications, past family history, past medical history, past social history, past surgical history and problem list.  Past Medical History Past Medical History  Diagnosis Date  . Depression   . Headache     migraines  . Kidney stone   . Chronic idiopathic constipation   . Tubal pregnancy   . Cancer [facility])     cervical cancer    Past Surgical History Past Surgical History  Procedure Laterality Date  . 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 Tubal ligation      Gynecologic History No obstetric history on file.  No LMP recorded. Patient is not currently having periods (Reason: IUD). Contraception: IUD Last Pap: 2016. Results were: normal   Obstetric History OB History  No data available    Current Medications Current Outpatient Prescriptions on File Prior to Visit  Medication Sig Dispense Refill  . levonorgestrel (MIRENA) 20 MCG/24HR IUD 1 each by Intrauterine route once.    . ondansetron (ZOFRAN ODT) 4 MG disintegrating tablet Take 1 tablet (4 mg total) by mouth every 8 (eight) hours as needed for nausea or vomiting. (Patient not taking: Reported on 09/04/2015) 20 tablet 0  . predniSONE (DELTASONE) 10 MG tablet Take 3 tabs all at once daily for 4 days (Patient not taking: Reported on 02/20/2015) 12 tablet 0  . sertraline (ZOLOFT) 25 MG tablet Take 1 tablet (25 mg total) by mouth daily. (Patient not  taking: Reported on 09/04/2015) 30 tablet 6  . traMADol (ULTRAM) 50 MG tablet Take 1 tablet (50 mg total) by mouth every 6 (six) hours as needed. (Patient not taking: Reported on 09/04/2015) 12 tablet 0   No current facility-administered medications on file prior to visit.    Review of Systems Patient denies any headaches, blurred vision, shortness of breath, chest pain, abdominal pain, problems with bowel movements, urination, or intercourse.  Objective:  BP 131/84 mmHg  Pulse 80  Ht 5\' 7"  (1.702 m)  Wt 142 lb 3.2 oz (64.501 kg)  BMI 22.27 kg/m2 Physical Exam  General:  Well developed, well nourished, no acute distress. She is alert and oriented x3. Skin:  Warm and dry Neck:  Midline trachea, no thyromegaly or nodules Cardiovascular: Regular rate and rhythm, no murmur heard Lungs:  Effort normal, all lung fields clear to auscultation bilaterally Breasts:  No dominant palpable mass, retraction, or nipple discharge, bilateral implants noted with well healed scars(had replacement surgery last month) Abdomen:  Soft, non tender, no hepatosplenomegaly or masses Pelvic:  External genitalia is normal in appearance.  The vagina is normal in appearance. The cervix is bulbous, no CMT. IUD string noted.  Thin prep pap is not done . Uterus is felt to be normal size, shape, and contour.  No adnexal masses or tenderness noted. Rectal: Good sphincter tone, no polyps, or hemorrhoids felt.  Hemoccult: **Extremities:  No swelling or varicosities noted Psych:  She has a normal mood and  affect  Assessment:   Healthy well-woman exam IUD check Depression   Plan:  Changed wellbutrin back to 75mg  bid as she felt better on that dose F/U 1 year for AE, or sooner if needed   Melody Rockney Ghee, CNM

## 2015-09-04 NOTE — Patient Instructions (Signed)
  Place annual gynecologic exam patient instructions here.  Thank you for enrolling in [facility]. Please follow the instructions below to securely access your online medical record. MyChart allows you to send messages to your doctor, view your test results, manage appointments, and more.   How Do I Sign Up? 1. In your Internet browser, go to [employer] and enter [email or web address]. 2. Click on the Sign Up Now link in the Sign In box. You will see the New Member Sign Up page. 3. Enter your MyChart Access Code exactly as it appears below. You will not need to use this code after you've completed the sign-up process. If you do not sign up before the expiration date, you must request a new code.  MyChart Access Code: [email or web address] Expires: 09/23/2015 10:50 AM  4. Enter your Social Security Number ([id number]) and Date of Birth (mm/dd/yyyy) as indicated and click Submit. You will be taken to the next sign-up page. 5. Create a MyChart ID. This will be your MyChart login ID and cannot be changed, so think of one that is secure and easy to remember. 6. Create a MyChart password. You can change your password at any time. 7. Enter your Password Reset Question and Answer. This can be used at a later time if you forget your password.  8. Enter your e-mail address. You will receive e-mail notification when new information is available in [place]. 9. Click Sign Up. You can now view your medical record.   Additional Information Remember, MyChart is NOT to be used for urgent needs. For medical emergencies, dial 911.

## 2016-02-07 ENCOUNTER — Encounter: Payer: Self-pay | Admitting: Family Medicine

## 2016-02-21 ENCOUNTER — Encounter: Payer: Self-pay | Admitting: Emergency Medicine

## 2016-02-21 ENCOUNTER — Emergency Department
Admission: EM | Admit: 2016-02-21 | Discharge: 2016-02-21 | Disposition: A | Discharge status: Home / Self Care | Payer: Managed Care, Other (non HMO) | Attending: Emergency Medicine | Admitting: Emergency Medicine

## 2016-02-21 DIAGNOSIS — Z8541 Personal history of malignant neoplasm of cervix uteri: Secondary | ICD-10-CM | POA: Diagnosis not present

## 2016-02-21 DIAGNOSIS — X140XXA Inhalation of hot air and gases, initial encounter: Secondary | ICD-10-CM | POA: Diagnosis not present

## 2016-02-21 DIAGNOSIS — J689 Unspecified respiratory condition due to chemicals, gases, fumes and vapors: Secondary | ICD-10-CM | POA: Insufficient documentation

## 2016-02-21 DIAGNOSIS — T59891A Toxic effect of other specified gases, fumes and vapors, accidental (unintentional), initial encounter: Secondary | ICD-10-CM | POA: Diagnosis not present

## 2016-02-21 DIAGNOSIS — T1490XA Injury, unspecified, initial encounter: Secondary | ICD-10-CM

## 2016-02-21 DIAGNOSIS — R42 Dizziness and giddiness: Secondary | ICD-10-CM | POA: Diagnosis present

## 2016-02-21 MED ORDER — ALBUTEROL SULFATE HFA 108 (90 BASE) MCG/ACT IN AERS: 2.0000 | INHALATION_SPRAY | Freq: Four times a day (QID) | RESPIRATORY_TRACT | 2 refills | Status: DC | PRN

## 2016-02-21 MED ORDER — IPRATROPIUM-ALBUTEROL 0.5-2.5 (3) MG/3ML IN SOLN
3.0000 mL | Freq: Once | RESPIRATORY_TRACT | Status: AC
  Administered 2016-02-21: 3 mL via RESPIRATORY_TRACT
  Filled 2016-02-21 (×2): qty 3

## 2016-02-21 NOTE — ED Triage Notes (Signed)
Pt presents to ed with dizziness and lightheaded after mixing ammonia and clorox  last night to clean her toilet. Pt states she inhaled a good amount by accident and presents today with dizziness and being lightheaded. Nad.

## 2016-02-21 NOTE — ED Notes (Signed)
Pt verbalized understanding of discharge instructions. NAD at this time. 

## 2016-02-21 NOTE — ED Provider Notes (Signed)
[facility] Emergency Department Provider Note        Time seen: ----------------------------------------- 4:43 PM on 02/21/2016 -----------------------------------------    I have reviewed the triage vital signs and the nursing notes.   HISTORY  Chief Complaint Dizziness (inhalation)    HPI Regina [name] is a 35 y.o. female who presents the ER for dizziness and lightheadedness after mixing ammonium and Clorox last night to clean her toilet. Patient states she inhaled some by accident and her symptoms started thereafter. Patient states she was coughing forcefully after that time, presents today with some dizziness and lightheadedness. She feels her heart is racing as well. She does not feel anxious or nervous.   Past Medical History:  Diagnosis Date  . Cancer (HCC)    cervical cancer  . Chronic idiopathic constipation   . Depression   . Headache    migraines  . Kidney stone   . Tubal pregnancy     Patient Active Problem List   Diagnosis Date Noted  . Dizziness 11/01/2014  . Bleeding 11/01/2014  . Weight loss, unintentional 11/01/2014    Past Surgical History:  Procedure Laterality Date  . TUBAL LIGATION    . tumor removed from mouth     non cancerous    Allergies Penicillins  Social History Social History  Substance Use Topics  . Smoking status: Never Smoker  . Smokeless tobacco: Never Used  . Alcohol use Yes     Comment: on occasion    Review of Systems Constitutional: Negative for fever. Cardiovascular: Negative for chest pain.Positive for palpitations Respiratory: Positive for shortness of breath Gastrointestinal: Negative for abdominal pain, vomiting and diarrhea. Genitourinary: Negative for dysuria. Musculoskeletal: Negative for back pain. Skin: Negative for rash. Neurological: Negative for headaches, focal weakness or numbness. Positive for dizziness  10-point ROS otherwise  negative.  ____________________________________________   PHYSICAL EXAM:  VITAL SIGNS: ED Triage Vitals  Enc Vitals Group     BP 02/21/16 1600 (!) 148/90     Pulse Rate 02/21/16 1600 97     Resp 02/21/16 1600 16     Temp 02/21/16 1600 98.2 F (36.8 C)     Temp Source 02/21/16 1600 Oral     SpO2 02/21/16 1600 100 %     Weight 02/21/16 1600 137 lb (62.1 kg)     Height 02/21/16 1600 5\' 7"  (1.702 m)     Head Circumference --      Peak Flow --      Pain Score 02/21/16 1627 4     Pain Loc --      Pain Edu? --      Excl. in [place]? --     Constitutional: Alert and oriented. Well appearing and in no distress. Eyes: Conjunctivae are normal. PERRL. Normal extraocular movements. ENT   Head: Normocephalic and atraumatic.   Nose: No congestion/rhinnorhea.   Mouth/Throat: Mucous membranes are moist.   Neck: No stridor. Cardiovascular: Normal rate, regular rhythm. No murmurs, rubs, or gallops. Respiratory: Normal respiratory effort without tachypnea nor retractions. Breath sounds are clear and equal bilaterally. No wheezes/rales/rhonchi. Gastrointestinal: Soft and nontender. Normal bowel sounds Musculoskeletal: Nontender with normal range of motion in all extremities. No lower extremity tenderness nor edema. Neurologic:  Normal speech and language. No gross focal neurologic deficits are appreciated.  Skin:  Skin is warm, dry and intact. No rash noted. Psychiatric: Mood and affect are normal. Speech and behavior are normal.  ____________________________________________  EKG: Interpreted by me.Normal sinus rhythm rate  of 74 bpm, normal PR interval, normal QRS, normal QT interval. Normal axis.  ____________________________________________  ED COURSE:  Pertinent labs & imaging results that were available during my care of the patient were reviewed by me and considered in my medical decision making (see chart for details). Clinical Course  Patient is in no acute distress,  likely with recent pulmonary irritant inhalation. We will obtain an EKG and given a DuoNeb.  Procedures ____________________________________________  FINAL ASSESSMENT AND PLAN  Inhalation injury  Plan: Patient with recent inhalation of noxious chemicals. Currently her exam is normal, vitals are normal and EKG is normal. She is stable for outpatient follow-up. I will prescribe for albuterol to take as needed.   Earleen Newport, MD   Note: This dictation was prepared with Dragon dictation. Any transcriptional errors that result from this process are unintentional    Earleen Newport, MD 02/21/16 [phone]

## 2016-06-04 ENCOUNTER — Other Ambulatory Visit (INDEPENDENT_AMBULATORY_CARE_PROVIDER_SITE_OTHER): Payer: Managed Care, Other (non HMO)

## 2016-06-04 ENCOUNTER — Ambulatory Visit (INDEPENDENT_AMBULATORY_CARE_PROVIDER_SITE_OTHER): Payer: Managed Care, Other (non HMO) | Admitting: Obstetrics and Gynecology

## 2016-06-04 ENCOUNTER — Encounter: Payer: Self-pay | Admitting: Obstetrics and Gynecology

## 2016-06-04 VITALS — BP 137/92 | HR 96 | Ht 67.0 in | Wt 139.3 lb

## 2016-06-04 DIAGNOSIS — R1031 Right lower quadrant pain: Secondary | ICD-10-CM | POA: Diagnosis not present

## 2016-06-04 DIAGNOSIS — R41840 Attention and concentration deficit: Secondary | ICD-10-CM

## 2016-06-04 MED ORDER — LINACLOTIDE 72 MCG PO CAPS: 72.0000 ug | ORAL_CAPSULE | Freq: Every day | ORAL | 1 refills | Status: DC

## 2016-06-04 MED ORDER — FLUOXETINE HCL 10 MG PO CAPS: 10.0000 mg | ORAL_CAPSULE | Freq: Every day | ORAL | 3 refills | Status: DC

## 2016-06-04 NOTE — Progress Notes (Signed)
Subjective:     Patient ID: Regina [name], female   DOB: 09/23/1980, 36 y.o.   MRN: [id number]  HPI Presents today with pain in RLQ and light spotting for "a few days" and reports she is unable to feel her IUD string. Bleeding from IUD placement stopped 6 months ago. Pertinent history includes ovarian cysts and kidney stones, and chronic constipation.   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 Patient states she has always wondered if she had ADD and recalls a history of trouble focusing throughout her life. She has tried multiple other antidepressants in the past and is interested in trying something new.   Review of Systems Negative except what's stated in HPI.    Objective:   Physical Exam A&O x4 Well groomed female in no acute distress Blood pressure (!) 137/92, pulse 96, height 5\' 7"  (1.702 m), weight 139 lb 4.8 oz (63.2 kg), last menstrual period 06/02/2016. Pelvic exam: VAGINA: normal appearing vagina with normal color and discharge, no lesions, UTERUS: uterus is normal size, shape, consistency and nontender, tenderness RLQ, ADNEXA: normal adnexa in size, nontender and no masses, tenderness right. Cervix normal in appearance. IUD string noted.   ultrasound Findings:  The uterus measures 7.5 x  3.4 x 4.7 cm . Echo texture is homogenous without evidence of focal masses.  The Endometrium measures 4.7 mm. IUD is seen within fundal endometrium.  Right Ovary measures 3.2 x 2.1 x 1.7 cm. It is normal in appearance. Left Ovary measures 4.1 x 2.7 x 2.6 cm. It is normal appearance. Survey of the adnexa demonstrates no adnexal masses. There is no free fluid in the cul de sac. Assessment:     RLQ pain, idiopathic  Constipation  Depression Difficulty concentrating     Plan:     Ultrasound showed nothing  concerning, only bowel and a lot of stool. RLQ pain is likely due to constipation. Patient was reassured and prescribed Linzess 72 mcg to try instead of her regular OTC laxative. Counseled about possible impaction and instructed to use the laxatives until she's having bowel movements at least every other day and/or the RLQ pain goes away.  Regarding depression and difficulty concentrating, Wellbutrin was discontinued and replaced with a trial of Prozac10mg  to be taken in the morning. Patient was referred to [facility] for ADD testing. We also discussed the significance of stressors in her life.    Patient advised to call with questions, concerns, or worsening pain. Follow up as needed  Lorelle Gibbs, CNM

## 2016-07-02 ENCOUNTER — Ambulatory Visit (INDEPENDENT_AMBULATORY_CARE_PROVIDER_SITE_OTHER): Payer: Managed Care, Other (non HMO) | Admitting: Family Medicine

## 2016-07-02 ENCOUNTER — Encounter: Payer: Self-pay | Admitting: Family Medicine

## 2016-07-02 DIAGNOSIS — F909 Attention-deficit hyperactivity disorder, unspecified type: Secondary | ICD-10-CM | POA: Diagnosis not present

## 2016-07-02 MED ORDER — AMPHETAMINE-DEXTROAMPHET ER 5 MG PO CP24: 5.0000 mg | ORAL_CAPSULE | Freq: Every day | ORAL | 0 refills | Status: DC

## 2016-07-02 NOTE — Progress Notes (Signed)
   BP 125/88   Pulse 84   Ht 5\' 7"  (1.702 m)   Wt 136 lb 11.2 oz (62 kg)   LMP 06/02/2016   SpO2 99%   BMI 21.41 kg/m    Subjective:    Patient ID: Regina [name], female    DOB: 1980-06-29, 36 y.o.   MRN: [id number]  HPI: Regina [name] is a 36 y.o. female  Chief Complaint  Patient presents with  . ADHD  Patient with ADHD symptoms coming on most notably now that she has started school again. Patient has a child under performed in school had trouble with ADHD symptoms was never diagnosed as she had a older brother with ADHD and had marked behavior issues. Patient now having trouble pain attention in school and on task and generally holding together for 10-15 minutes then things deteriorate. Has not ever taken stimulants before. No self-medication. Taking fluoxetine for postpartum depression done seem like it's doing much  Relevant past medical, surgical, family and social history reviewed and updated as indicated. Interim medical history since our last visit reviewed. Allergies and medications reviewed and updated.  Review of Systems  Constitutional: Negative.   Respiratory: Negative.   Cardiovascular: Negative.     Per HPI unless specifically indicated above     Objective:    BP 125/88   Pulse 84   Ht 5\' 7"  (1.702 m)   Wt 136 lb 11.2 oz (62 kg)   LMP 06/02/2016   SpO2 99%   BMI 21.41 kg/m   Wt Readings from Last 3 Encounters:  07/02/16 136 lb 11.2 oz (62 kg)  06/04/16 139 lb 4.8 oz (63.2 kg)  02/21/16 137 lb (62.1 kg)    Physical Exam  Constitutional: She is oriented to person, place, and time. She appears well-developed and well-nourished.  HENT:  Head: Normocephalic and atraumatic.  Eyes: Conjunctivae and EOM are normal.  Neck: Normal range of motion.  Cardiovascular: Normal rate, regular rhythm and normal heart sounds.   Pulmonary/Chest: Effort normal and breath sounds normal.  Musculoskeletal: Normal range of motion.  Neurological: She is alert and  oriented to person, place, and time.  Skin: No erythema.  Psychiatric: She has a normal mood and affect. Her behavior is normal. Judgment and thought content normal.    Results for orders placed or performed in visit on 09/04/15  HM PAP SMEAR  Result Value Ref Range   HM Pap smear negative, HPV-       Assessment & Plan:   Problem List Items Addressed This Visit      Other   ADHD    Tova testing done today which is positive for ADHD will start low-dose Adderall recheck Tova test next week on Adderall 5 mg.          Follow up plan: Return in about 1 week (around 07/09/2016).

## 2016-07-02 NOTE — Assessment & Plan Note (Signed)
Tova testing done today which is positive for ADHD will start low-dose Adderall recheck Tova test next week on Adderall 5 mg.

## 2016-07-09 ENCOUNTER — Ambulatory Visit (INDEPENDENT_AMBULATORY_CARE_PROVIDER_SITE_OTHER): Payer: 59 | Admitting: Family Medicine

## 2016-07-09 ENCOUNTER — Encounter: Payer: Self-pay | Admitting: Family Medicine

## 2016-07-09 DIAGNOSIS — F909 Attention-deficit hyperactivity disorder, unspecified type: Secondary | ICD-10-CM | POA: Diagnosis not present

## 2016-07-09 MED ORDER — AMPHETAMINE-DEXTROAMPHETAMINE 5 MG PO TABS: 2.5000 mg | ORAL_TABLET | Freq: Two times a day (BID) | ORAL | 0 refills | Status: DC

## 2016-07-09 NOTE — Progress Notes (Signed)
   BP 126/82 (BP Location: Left Arm, Patient Position: Sitting, Cuff Size: Small)   Pulse 73   Temp 98.2 F (36.8 C)   Wt 135 lb (61.2 kg)   SpO2 100%   BMI 21.14 kg/m    Subjective:    Patient ID: Regina [name], female    DOB: [birth year], 36 y.o.   MRN: [id number]  HPI: Regina [name] is a 36 y.o. female  Chief Complaint  Patient presents with  . ADHD    repeat TOVA test, pt started taking Adderall XR 5mg , pt states that she has been having terrible migraines since taking this medication. She also states that the medication is going to be very expensive since she is loosing her insurace soon    Patient also gone in and having some anxiety affects. Taking the test she felt she may more mistakes than ever.  Relevant past medical, surgical, family and social history reviewed and updated as indicated. Interim medical history since our last visit reviewed. Allergies and medications reviewed and updated.  Review of Systems  Constitutional: Negative.   Respiratory: Negative.   Cardiovascular: Negative.     Per HPI unless specifically indicated above     Objective:    BP 126/82 (BP Location: Left Arm, Patient Position: Sitting, Cuff Size: Small)   Pulse 73   Temp 98.2 F (36.8 C)   Wt 135 lb (61.2 kg)   SpO2 100%   BMI 21.14 kg/m   Wt Readings from Last 3 Encounters:  07/09/16 135 lb (61.2 kg)  07/02/16 136 lb 11.2 oz (62 kg)  06/04/16 139 lb 4.8 oz (63.2 kg)    Physical Exam  Constitutional: She is oriented to person, place, and time. She appears well-developed and well-nourished.  HENT:  Head: Normocephalic and atraumatic.  Eyes: Conjunctivae and EOM are normal.  Neck: Normal range of motion.  Cardiovascular: Normal rate, regular rhythm and normal heart sounds.   Pulmonary/Chest: Effort normal and breath sounds normal.  Musculoskeletal: Normal range of motion.  Neurological: She is alert and oriented to person, place, and time.  Skin: No erythema.    Psychiatric: She has a normal mood and affect. Her behavior is normal. Judgment and thought content normal.    Results for orders placed or performed in visit on 09/04/15  HM PAP SMEAR  Result Value Ref Range   HM Pap smear negative, HPV-       Assessment & Plan:   Problem List Items Addressed This Visit      Other   ADHD    Discussed total report which is markedly improved but with more errors of admission. Because of side effects and increased error rate will decrease dose to 2.5. Also because of cost medication will use immediate release Adderall 5 mg half a tablet twice a day. Patient education given on splitting tablets and timing of medications with sleep and activities. We will repeat Tova test in 3 weeks.          Follow up plan: Return for repeat TOVA.

## 2016-07-09 NOTE — Assessment & Plan Note (Signed)
Discussed total report which is markedly improved but with more errors of admission. Because of side effects and increased error rate will decrease dose to 2.5. Also because of cost medication will use immediate release Adderall 5 mg half a tablet twice a day. Patient education given on splitting tablets and timing of medications with sleep and activities. We will repeat Tova test in 3 weeks.

## 2016-07-30 ENCOUNTER — Ambulatory Visit (INDEPENDENT_AMBULATORY_CARE_PROVIDER_SITE_OTHER): Payer: 59 | Admitting: Family Medicine

## 2016-07-30 ENCOUNTER — Encounter: Payer: Self-pay | Admitting: Family Medicine

## 2016-07-30 VITALS — BP 117/85 | HR 134 | Temp 98.3°F | Wt 133.8 lb

## 2016-07-30 DIAGNOSIS — F909 Attention-deficit hyperactivity disorder, unspecified type: Secondary | ICD-10-CM | POA: Diagnosis not present

## 2016-07-30 DIAGNOSIS — R6889 Other general symptoms and signs: Secondary | ICD-10-CM

## 2016-07-30 DIAGNOSIS — J019 Acute sinusitis, unspecified: Secondary | ICD-10-CM

## 2016-07-30 LAB — VERITOR FLU A/B WAIVED
INFLUENZA A: NEGATIVE
INFLUENZA B: NEGATIVE

## 2016-07-30 MED ORDER — AZITHROMYCIN 250 MG PO TABS: ORAL_TABLET | ORAL | 0 refills | Status: DC

## 2016-07-30 MED ORDER — METHYLPHENIDATE HCL 5 MG PO TABS: 2.5000 mg | ORAL_TABLET | Freq: Two times a day (BID) | ORAL | 0 refills | Status: DC

## 2016-07-30 NOTE — Assessment & Plan Note (Signed)
Possible side effects to Adderall will change to Ritalin 5 mg half a tablet twice a day recheck for Tova test in a month or so.

## 2016-07-30 NOTE — Progress Notes (Signed)
BP 117/85 (BP Location: Left Arm, Patient Position: Sitting, Cuff Size: Normal)   Pulse (!) 134   Temp 98.3 F (36.8 C)   Wt 133 lb 12.8 oz (60.7 kg)   SpO2 100%   BMI 20.96 kg/m    Subjective:    Patient ID: Regina [name], female    DOB: August 21, 1980, 36 y.o.   MRN: [id number]  HPI: Regina [name] is a 36 y.o. female  Chief Complaint  Patient presents with  . URI    pt states she started feeling bad on the 14th of this month but started feeling better recently. On Tuesday, she states she started having a sore throat, diarrhea, fever, and congestion. States she has recently taken ibuporfen.   Also having marked aches has been using over-the-counter medications some sinus pressure congestion.  For ADHD will not do Tova test today patient having some side effects with Adderall of tingling burning and extremities both legs and arms. Last about 2 hours after she takes medicine goes away then it comes back with new dosing. Has noticed some improvement with low-dose Adderall.  Relevant past medical, surgical, family and social history reviewed and updated as indicated. Interim medical history since our last visit reviewed. Allergies and medications reviewed and updated.  Review of Systems  Constitutional: Positive for chills, diaphoresis, fatigue and fever.  HENT: Positive for congestion, sinus pain and sore throat.   Respiratory: Positive for cough. Negative for shortness of breath.   Cardiovascular: Negative.     Per HPI unless specifically indicated above     Objective:    BP 117/85 (BP Location: Left Arm, Patient Position: Sitting, Cuff Size: Normal)   Pulse (!) 134   Temp 98.3 F (36.8 C)   Wt 133 lb 12.8 oz (60.7 kg)   SpO2 100%   BMI 20.96 kg/m   Wt Readings from Last 3 Encounters:  07/30/16 133 lb 12.8 oz (60.7 kg)  07/09/16 135 lb (61.2 kg)  07/02/16 136 lb 11.2 oz (62 kg)    Physical Exam  Constitutional: She is oriented to person, place, and time. She  appears well-developed and well-nourished.  HENT:  Head: Normocephalic and atraumatic.  Right Ear: External ear normal.  Left Ear: External ear normal.  Mouth/Throat: Oropharyngeal exudate present.  Eyes: Conjunctivae and EOM are normal.  Neck: Normal range of motion.  Cardiovascular: Normal rate, regular rhythm and normal heart sounds.   Pulmonary/Chest: Effort normal and breath sounds normal.  Musculoskeletal: Normal range of motion.  Lymphadenopathy:    She has no cervical adenopathy.  Neurological: She is alert and oriented to person, place, and time.  Skin: No erythema.  Psychiatric: She has a normal mood and affect. Her behavior is normal. Judgment and thought content normal.   flu test negative  Results for orders placed or performed in visit on 09/04/15  HM PAP SMEAR  Result Value Ref Range   HM Pap smear negative, HPV-       Assessment & Plan:   Problem List Items Addressed This Visit      Other   ADHD    Possible side effects to Adderall will change to Ritalin 5 mg half a tablet twice a day recheck for Tova test in a month or [name].       Other Visit Diagnoses    Flu-like symptoms    -  Primary   Relevant Orders   Veritor Flu A/B Waived   Acute sinusitis, recurrence not specified, unspecified location  Discuss care and treatment use of over-the-counter medications Mucinex Tylenol sinus etc.   Relevant Medications   azithromycin (ZITHROMAX) 250 MG tablet       Follow up plan: Return in about 4 weeks (around 08/27/2016) for Tova testing.

## 2016-11-30 ENCOUNTER — Other Ambulatory Visit: Payer: Self-pay | Admitting: Obstetrics and Gynecology

## 2016-12-01 NOTE — Telephone Encounter (Signed)
Needs annual exam before more refills, please have patient schedule

## 2016-12-01 NOTE — Telephone Encounter (Signed)
Call to [facility] to schedule her for an AE - she states that she doesn't have any insurance coverage at this time. Informed her that no refills would be given until AE is completed, she will call as soon as she gets insurance coverage.

## 2017-03-17 ENCOUNTER — Ambulatory Visit (INDEPENDENT_AMBULATORY_CARE_PROVIDER_SITE_OTHER): Payer: BLUE CROSS/BLUE SHIELD | Admitting: Obstetrics and Gynecology

## 2017-03-17 ENCOUNTER — Encounter: Payer: Self-pay | Admitting: Obstetrics and Gynecology

## 2017-03-17 VITALS — BP 114/75 | HR 63 | Ht 67.0 in | Wt 144.8 lb

## 2017-03-17 DIAGNOSIS — Z01419 Encounter for gynecological examination (general) (routine) without abnormal findings: Secondary | ICD-10-CM

## 2017-03-17 DIAGNOSIS — G43839 Menstrual migraine, intractable, without status migrainosus: Secondary | ICD-10-CM | POA: Diagnosis not present

## 2017-03-17 DIAGNOSIS — R140 Abdominal distension (gaseous): Secondary | ICD-10-CM

## 2017-03-17 DIAGNOSIS — Z23 Encounter for immunization: Secondary | ICD-10-CM | POA: Diagnosis not present

## 2017-03-17 MED ORDER — BUPROPION HCL 75 MG PO TABS: ORAL_TABLET | ORAL | 0 refills | Status: DC

## 2017-03-17 MED ORDER — METHYLPHENIDATE HCL 5 MG PO TABS: 2.5000 mg | ORAL_TABLET | Freq: Two times a day (BID) | ORAL | 0 refills | Status: DC

## 2017-03-17 MED ORDER — SUMATRIPTAN SUCCINATE 50 MG PO TABS: 50.0000 mg | ORAL_TABLET | ORAL | 2 refills | Status: DC | PRN

## 2017-03-17 NOTE — Progress Notes (Signed)
Subjective:   Regina [name] is a 36 y.o. No obstetric history on file. Caucasian female here for a routine well-woman exam.  No LMP recorded. Patient is not currently having periods (Reason: IUD).    Current complaints: left sided pain persist and now on right side, always after eating. Will refer to GI. Also having monthly migraines. Desires imitrex prescription. PCP: Jeananne Rama       does desire labs  Social History: Sexual: heterosexual Marital Status: divorced Living situation: with children Occupation: ? Tobacco/alcohol: no tobacco use Illicit drugs: no history of illicit drug use  The following portions of the patient's history were reviewed and updated as appropriate: allergies, current medications, past family history, past medical history, past social history, past surgical history and problem list.  Past Medical History Past Medical History:  Diagnosis Date  . Cancer (HCC)    cervical cancer  . Chronic idiopathic constipation   . Depression   . Headache    migraines  . Kidney stone   . Tubal pregnancy     Past Surgical History Past Surgical History:  Procedure Laterality Date  . TUBAL LIGATION    . 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  No LMP recorded. Patient is not currently having periods (Reason: IUD). Contraception: IUD Last Pap: 2016. Results were: normal   Obstetric History OB History  No data available    Current Medications Current Outpatient Medications on File Prior to Visit  Medication Sig Dispense Refill  . levonorgestrel (MIRENA) 20 MCG/24HR IUD 1 each by Intrauterine route once.    Marland Kitchen albuterol (PROVENTIL HFA;VENTOLIN HFA) 108 (90 Base) MCG/ACT inhaler Inhale 2 puffs into the lungs every 6 (six) hours as needed for wheezing or shortness of breath. (Patient not taking: Reported on 03/17/2017) 1 Inhaler 2  . azithromycin (ZITHROMAX) 250 MG tablet 2 now then 1 a day (Patient not  taking: Reported on 03/17/2017) 6 tablet 0  . buPROPion (WELLBUTRIN) 75 MG tablet TAKE 1 TABLET(75 MG) BY MOUTH TWICE DAILY (Patient not taking: Reported on 03/17/2017) 30 tablet 0  . FLUoxetine (PROZAC) 10 MG capsule Take 1 capsule (10 mg total) by mouth daily. (Patient not taking: Reported on 03/17/2017) 30 capsule 3  . linaclotide (LINZESS) 72 MCG capsule Take 1 capsule (72 mcg total) by mouth daily before breakfast. (Patient not taking: Reported on 03/17/2017) 30 capsule 1  . methylphenidate (RITALIN) 5 MG tablet Take 0.5 tablets (2.5 mg total) by mouth 2 (two) times daily. (Patient not taking: Reported on 03/17/2017) 30 tablet 0   No current facility-administered medications on file prior to visit.     Review of Systems Patient denies any headaches, blurred vision, shortness of breath, chest pain, abdominal pain, problems with bowel movements, urination, or intercourse.  Objective:  BP 114/75   Pulse 63   Ht 5\' 7"  (1.702 m)   Wt 144 lb 12.8 oz (65.7 kg)   BMI 22.68 kg/m  Physical Exam  General:  Well developed, well nourished, no acute distress. She is alert and oriented x3. Skin:  Warm and dry Neck:  Midline trachea, no thyromegaly or nodules Cardiovascular: Regular rate and rhythm, no murmur heard Lungs:  Effort normal, all lung fields clear to auscultation bilaterally Breasts:  No dominant palpable mass, retraction, or nipple discharge Abdomen:  Soft, non tender, no hepatosplenomegaly or masses Pelvic:  External genitalia is normal in appearance.  The vagina is normal in appearance.  The cervix is bulbous, no CMT.  Thin prep pap is not done. Uterus is felt to be normal size, shape, and contour.  No adnexal masses or tenderness noted. IUD string noted Extremities:  No swelling or varicosities noted Psych:  She has a normal mood and affect  Assessment:   Healthy well-woman exam Menstrual migraines Upper abdominal pains-postprandal ADD IUD check Needs flu vaccine  Plan:  Labs  obtained- will follow up accordingly meds refilled Flu vaccine given Referred to GI F/U 1 year for AE, or sooner if needed  Nandana Krolikowski Rockney Ghee, CNM

## 2017-03-18 ENCOUNTER — Telehealth: Payer: Self-pay | Admitting: Obstetrics and Gynecology

## 2017-03-18 LAB — LIPID PANEL
CHOL/HDL RATIO: 2.7 ratio (ref 0.0–4.4)
Cholesterol, Total: 157 mg/dL (ref 100–199)
HDL: 59 mg/dL (ref >39)
LDL Calculated: 90 mg/dL (ref 0–99)
TRIGLYCERIDES: 42 mg/dL (ref 0–149)
VLDL CHOLESTEROL CAL: 8 mg/dL (ref 5–40)

## 2017-03-18 LAB — COMPREHENSIVE METABOLIC PANEL
A/G RATIO: 2.4 — AB (ref 1.2–2.2)
ALT: 16 IU/L (ref 0–32)
AST: 17 IU/L (ref 0–40)
Albumin: 4.8 g/dL (ref 3.5–5.5)
Alkaline Phosphatase: 63 IU/L (ref 39–117)
BILIRUBIN TOTAL: 0.3 mg/dL (ref 0.0–1.2)
BUN/Creatinine Ratio: 11 (ref 9–23)
BUN: 9 mg/dL (ref 6–20)
CHLORIDE: 102 mmol/L (ref 96–106)
CO2: 27 mmol/L (ref 20–29)
Calcium: 9.4 mg/dL (ref 8.7–10.2)
Creatinine, Ser: 0.81 mg/dL (ref 0.57–1.00)
GFR calc non Af Amer: 94 mL/min/{1.73_m2} (ref >59)
GFR, EST AFRICAN AMERICAN: 108 mL/min/{1.73_m2} (ref >59)
Globulin, Total: 2 g/dL (ref 1.5–4.5)
Glucose: 94 mg/dL (ref 65–99)
POTASSIUM: 4.3 mmol/L (ref 3.5–5.2)
Sodium: 140 mmol/L (ref 134–144)
TOTAL PROTEIN: 6.8 g/dL (ref 6.0–8.5)

## 2017-03-18 NOTE — Telephone Encounter (Signed)
Patient called and stated that her medication is wrong. The medication buPROPion (WELLBUTRIN) 75 MG tablet only has a 15 day supply. The patient would like the medication corrected. No other information was disclosed. Please advise.

## 2017-03-19 ENCOUNTER — Other Ambulatory Visit: Payer: Self-pay | Admitting: *Deleted

## 2017-03-19 MED ORDER — BUPROPION HCL 75 MG PO TABS: ORAL_TABLET | ORAL | 2 refills | Status: DC

## 2017-03-19 NOTE — Telephone Encounter (Signed)
Done-ac 

## 2017-03-25 ENCOUNTER — Ambulatory Visit (INDEPENDENT_AMBULATORY_CARE_PROVIDER_SITE_OTHER): Payer: BLUE CROSS/BLUE SHIELD | Admitting: Gastroenterology

## 2017-03-25 ENCOUNTER — Encounter: Payer: Self-pay | Admitting: Gastroenterology

## 2017-03-25 ENCOUNTER — Other Ambulatory Visit
Admission: RE | Admit: 2017-03-25 | Discharge: 2017-03-25 | Disposition: A | Discharge status: Home / Self Care | Payer: BLUE CROSS/BLUE SHIELD | Source: Ambulatory Visit | Attending: Gastroenterology | Admitting: Gastroenterology

## 2017-03-25 VITALS — BP 115/80 | HR 85 | Ht 67.0 in | Wt 142.4 lb

## 2017-03-25 DIAGNOSIS — R109 Unspecified abdominal pain: Secondary | ICD-10-CM

## 2017-03-25 DIAGNOSIS — K581 Irritable bowel syndrome with constipation: Secondary | ICD-10-CM

## 2017-03-25 LAB — CBC WITH DIFFERENTIAL/PLATELET
Basophils Absolute: 0 10*3/uL (ref 0–0.1)
Basophils Relative: 1 %
EOS ABS: 0.1 10*3/uL (ref 0–0.7)
EOS PCT: 2 %
HCT: 41.6 % (ref 35.0–47.0)
Hemoglobin: 13.9 g/dL (ref 12.0–16.0)
LYMPHS ABS: 1.7 10*3/uL (ref 1.0–3.6)
Lymphocytes Relative: 30 %
MCH: 30.2 pg (ref 26.0–34.0)
MCHC: 33.4 g/dL (ref 32.0–36.0)
MCV: 90.5 fL (ref 80.0–100.0)
Monocytes Absolute: 0.4 10*3/uL (ref 0.2–0.9)
Monocytes Relative: 7 %
Neutro Abs: 3.4 10*3/uL (ref 1.4–6.5)
Neutrophils Relative %: 60 %
PLATELETS: 257 10*3/uL (ref 150–440)
RBC: 4.6 MIL/uL (ref 3.80–5.20)
RDW: 13.2 % (ref 11.5–14.5)
WBC: 5.6 10*3/uL (ref 3.6–11.0)

## 2017-03-25 MED ORDER — BISACODYL 10 MG RE SUPP: 10.0000 mg | Freq: Every day | RECTAL | 1 refills | Status: DC

## 2017-03-25 NOTE — Progress Notes (Signed)
[name], MD [address], [place], [place], [place], [address] [address], [place], [place], [place], [address] Phone: [phone]  Fax: [phone]  Consultation  Referring Provider:     Joylene Igo, CNM Primary Care Physician:  Guadalupe Maple, MD Primary Gastroenterologist:  Virgel Manifold, MD        Reason for Consultation:     Constipation  Date of Consultation:  03/25/2017         HPI:   Regina [name] is a 36 y.o. female with history of chronic constipation for years presents for initial visit.  Patient states that 11 years ago she had what she describes as a sits marker study and since then was told that her bowel moves slowly.  She states before she was pregnant had her 3 children, she had loose stools and since then has had constipation.  She has been tried on Linzess and states that it does not help and so she is not on it anymore.  No weight loss.  Reports intermittent bright red blood in stool at times on wiping only.  States without any medication she can go 3 weeks without having a bowel movement.  She has history of depression.  No family history of colon cancer.  Reportedly her father had a colonoscopy that revealed a "precancerous mass" and had to have a section of his colon removed.  I do not have the report of this and patient will try to obtain in for [place].  I suspect that this was likely a large polyp as patient states that it was not cancer.  She recently purchased a Dr. Karrie Doffing colon cleanse from [place] and uses it regularly which allows her to have regular bowel movements when she uses it.  The ingredients of this consists of psyllium husk, senna leaf, licorice, flaxseed, cascara, lactobacillus, medium-chain triglycerides oil, aloe vera gel.  Past Medical History:  Diagnosis Date  . Cancer (HCC)    cervical cancer  . Chronic idiopathic constipation   . Depression   . Headache    migraines  . Kidney stone   . Tubal pregnancy      Past Surgical History:  Procedure Laterality Date  . TUBAL LIGATION    . tumor removed from mouth     non cancerous    Prior to Admission medications   Medication Sig Start Date End Date Taking? Authorizing Provider  buPROPion (WELLBUTRIN) 75 MG tablet TAKE 1 TABLET(75 MG) BY MOUTH TWICE DAILY 03/19/17  Yes Shambley, Melody N, CNM  levonorgestrel (MIRENA) 20 MCG/24HR IUD 1 each by Intrauterine route once.   Yes [provider]  methylphenidate (RITALIN) 5 MG tablet Take 0.5 tablets (2.5 mg total) 2 (two) times daily by mouth. 03/17/17  Yes Shambley, Melody N, CNM  NON FORMULARY    Yes [provider]  SUMAtriptan (IMITREX) 50 MG tablet Take 1 tablet (50 mg total) every 2 (two) hours as needed by mouth for migraine. May repeat in 2 hours if headache persists or recurs. 03/17/17  Yes Shambley, Melody N, CNM  albuterol (PROVENTIL HFA;VENTOLIN HFA) 108 (90 Base) MCG/ACT inhaler Inhale 2 puffs into the lungs every 6 (six) hours as needed for wheezing or shortness of breath. Patient not taking: Reported on 03/25/2017 02/21/16   Earleen Newport, MD  bisacodyl (DULCOLAX) 10 MG suppository Place 1 suppository (10 mg total) at bedtime rectally. 03/25/17   Virgel Manifold, MD    Family History  Problem Relation  Age of Onset  . Diabetes Mother   . Hypertension Mother   . Cancer Father        colon  . Diabetes Father   . Hypertension Father   . Migraines Father   . Diabetes Maternal Grandfather      Social History   Tobacco Use  . Smoking status: Never Smoker  . Smokeless tobacco: Never Used  Substance Use Topics  . Alcohol use: Yes    Comment: on occasion  . Drug use: No    Allergies as of 03/25/2017 - Review Complete 03/25/2017  Allergen Reaction Noted  . Penicillins Nausea And Vomiting 11/01/2014    Review of Systems:    All systems reviewed and negative except where noted in HPI.   Physical Exam:  Vital signs in last 24 hours: Vital signs  reviewed   General:   Pleasant, cooperative in NAD Head:  Normocephalic and atraumatic. Eyes:   No icterus.   Conjunctiva pink. PERRLA. Ears:  Normal auditory acuity. Neck:  Supple; no masses or thyroidomegaly Lungs: Respirations even and unlabored. Lungs clear to auscultation bilaterally.   No wheezes, crackles, or rhonchi.  Heart:  Regular rate and rhythm;  Without murmur, clicks, rubs or gallops Abdomen:  Soft, nondistended, nontender. Normal bowel sounds. No appreciable masses or hepatomegaly.  No rebound or guarding.  Rectal: Good rectal Tone, no hemorrhoids, no bright red blood on digital rectal exam, no melena, solid stool in rectum neurologic:  Alert and oriented x3;  grossly normal neurologically. Skin:  Intact without significant lesions or rashes. Cervical Nodes:  No significant cervical adenopathy. Psych:  Alert and cooperative. Normal affect.  LAB RESULTS: Recent Labs    03/25/17 0949  WBC 5.6  HGB 13.9  HCT 41.6  PLT 257   BMET No results for input(s): NA, K, CL, CO2, GLUCOSE, BUN, CREATININE, CALCIUM in the last 72 hours. LFT No results for input(s): PROT, ALBUMIN, AST, ALT, ALKPHOS, BILITOT, BILIDIR, IBILI in the last 72 hours. PT/INR No results for input(s): LABPROT, INR in the last 72 hours.  Last CMP from November 2018 reviewed and normal STUDIES: No results found. CT report from January 2017 normal   Impression / Plan:   Regina [name] is a 36 y.o. 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 Karrie Doffing colon Cleanse as some of the ingredients can lead to adverse effects especially if taken long-term. We will check CBC as this has not been checked in a long  time and patient had some intermittent bright red blood per rectum that are more consistent with hemorrhoids Patient asked to obtain report of her father's colonoscopy to determine if there were any reports of colon cancer as that would qualify for an early colonoscopy for her.  She verbalized understanding If symptoms do not resolve anorectal manometry or defecography can be considered in the future to evaluate for pelvic dyssynergia  Thank you for involving me in the care of this patient.      Virgel Manifold, MD  03/25/2017, 10:42 AM

## 2017-03-25 NOTE — Patient Instructions (Addendum)
Use Miralax  - 2 capfuls twice daily.  Samples of IBgard were given today. Please take as directed. This is an over the counter medication. You can purchase from any pharmacy.   CBC lab order has been ordered today. Please go to the [facility] mall registration desk to check in. They will direct you to the lab.

## 2017-03-29 ENCOUNTER — Ambulatory Visit: Payer: Managed Care, Other (non HMO) | Admitting: Gastroenterology

## 2017-07-07 ENCOUNTER — Encounter: Payer: Self-pay | Admitting: Family Medicine

## 2017-07-07 ENCOUNTER — Ambulatory Visit (INDEPENDENT_AMBULATORY_CARE_PROVIDER_SITE_OTHER): Payer: BLUE CROSS/BLUE SHIELD | Admitting: Family Medicine

## 2017-07-07 VITALS — BP 121/84 | HR 80 | Temp 97.8°F | Wt 145.4 lb

## 2017-07-07 DIAGNOSIS — H6591 Unspecified nonsuppurative otitis media, right ear: Secondary | ICD-10-CM

## 2017-07-07 DIAGNOSIS — F909 Attention-deficit hyperactivity disorder, unspecified type: Secondary | ICD-10-CM

## 2017-07-07 DIAGNOSIS — G43909 Migraine, unspecified, not intractable, without status migrainosus: Secondary | ICD-10-CM

## 2017-07-07 MED ORDER — DULOXETINE HCL 20 MG PO CPEP: 20.0000 mg | ORAL_CAPSULE | Freq: Every day | ORAL | 1 refills | Status: DC

## 2017-07-07 MED ORDER — PROPRANOLOL HCL ER 80 MG PO CP24: 80.0000 mg | ORAL_CAPSULE | Freq: Every day | ORAL | 1 refills | Status: DC

## 2017-07-07 MED ORDER — RIZATRIPTAN BENZOATE 10 MG PO TABS: 10.0000 mg | ORAL_TABLET | ORAL | 0 refills | Status: DC | PRN

## 2017-07-07 MED ORDER — METHYLPHENIDATE HCL 5 MG PO TABS: 5.0000 mg | ORAL_TABLET | Freq: Two times a day (BID) | ORAL | 0 refills | Status: DC | PRN

## 2017-07-07 MED ORDER — ONDANSETRON 4 MG PO TBDP: 4.0000 mg | ORAL_TABLET | Freq: Three times a day (TID) | ORAL | 0 refills | Status: DC | PRN

## 2017-07-07 MED ORDER — TRAMADOL HCL 50 MG PO TABS: 50.0000 mg | ORAL_TABLET | Freq: Three times a day (TID) | ORAL | 0 refills | Status: DC | PRN

## 2017-07-07 NOTE — Patient Instructions (Signed)
Sudafed and flonase for the ear pressure

## 2017-07-07 NOTE — Progress Notes (Signed)
BP 121/84 (BP Location: Left Arm, Patient Position: Sitting, Cuff Size: Normal)   Pulse 80   Temp 97.8 F (36.6 C) (Oral)   Wt 145 lb 6.4 oz (66 kg)   SpO2 100%   BMI 22.77 kg/m    Subjective:    Patient ID: Regina [name], female    DOB: 10-05-1980, 37 y.o.   MRN: [id number]  HPI: Regina [name] is a 37 y.o. female  Chief Complaint  Patient presents with  . Migraine    x's 3 days. Took 4 ibuprofen at 8AM. Went to Chiropractor yesterday, got relief but patient states when she woke back up it was back.  . Ear Pain    Has had ear pain sometimes sharp over last couple of weeks and ear fullness. Right ear.   3 day hx of persistent migraine on the right side. Some relief with chiropractic adjustment yesterday but came back this morning upon waking. Has had them chronically since 13 several times annually, but the past few months has been 2-3 times per week. Has tried tylenol, imitrex (1/2 tablet once) BC powder, and ibuprofen with no relief. Some nausea, no vomiting, major visual changes, dizziness, confusion. Thinks they may flare worse during certain points of her cycle.   Also having right ear pain and pressure intermittently the past few weeks. Denies recent illness, fevers, muffled hearing,   Doing fairly well on ritalin prn for her ADHD, but states some days it isn't lasting long enough whatsoever. Has had significant worsening of headaches on adderall and ritalin XR in the past [name] prefers the ritalin short release. Denies Cp, SOB, palpitations.   Past Medical History:  Diagnosis Date  . Cancer (HCC)    cervical cancer  . Chronic idiopathic constipation   . Depression   . Headache    migraines  . Kidney stone   . Tubal pregnancy    Social History   Socioeconomic History  . Marital status: Single    Spouse name: Not on file  . Number of children: Not on file  . Years of education: Not on file  . Highest education level: Not on file  Social Needs  . Financial  resource strain: Not on file  . Food insecurity - worry: Not on file  . Food insecurity - inability: Not on file  . Transportation needs - medical: Not on file  . Transportation needs - non-medical: Not on file  Occupational History  . Not on file  Tobacco Use  . Smoking status: Never Smoker  . Smokeless tobacco: Never Used  Substance and Sexual Activity  . Alcohol use: Yes    Comment: on occasion  . Drug use: No  . Sexual activity: Yes    Birth control/protection: Surgical    Comment: mirena  Other Topics Concern  . Not on file  Social History Narrative  . Not on file   Relevant past medical, surgical, family and social history reviewed and updated as indicated. Interim medical history since our last visit reviewed. Allergies and medications reviewed and updated.  Review of Systems  Per HPI unless specifically indicated above     Objective:    BP 121/84 (BP Location: Left Arm, Patient Position: Sitting, Cuff Size: Normal)   Pulse 80   Temp 97.8 F (36.6 C) (Oral)   Wt 145 lb 6.4 oz (66 kg)   SpO2 100%   BMI 22.77 kg/m   Wt Readings from Last 3 Encounters:  07/07/17 145 lb 6.4  oz (66 kg)  03/25/17 142 lb 6.4 oz (64.6 kg)  03/17/17 144 lb 12.8 oz (65.7 kg)    Physical Exam  Constitutional: She is oriented to person, place, and time. She appears well-developed and well-nourished. No distress.  HENT:  Head: Atraumatic.  Right middle ear effusion Left ear benign  Eyes: Conjunctivae are normal. Pupils are equal, round, and reactive to light.  Neck: Normal range of motion. Neck supple.  Cardiovascular: Normal rate and normal heart sounds.  Pulmonary/Chest: Effort normal and breath sounds normal. No respiratory distress.  Musculoskeletal: Normal range of motion.  Neurological: She is alert and oriented to person, place, and time. No cranial nerve deficit. She exhibits normal muscle tone.  Skin: Skin is warm and dry.  Psychiatric: She has a normal mood and affect.  Her behavior is normal.  Nursing note and vitals reviewed.   Results for orders placed or performed during the hospital encounter of 03/25/17  CBC with Differential/Platelet  Result Value Ref Range   WBC 5.6 3.6 - 11.0 K/uL   RBC 4.60 3.80 - 5.20 MIL/uL   Hemoglobin 13.9 12.0 - 16.0 g/dL   HCT 41.6 35.0 - 47.0 %   MCV 90.5 80.0 - 100.0 fL   MCH 30.2 26.0 - 34.0 pg   MCHC 33.4 32.0 - 36.0 g/dL   RDW 13.2 11.5 - 14.5 %   Platelets 257 150 - 440 K/uL   Neutrophils Relative % 60 %   Neutro Abs 3.4 1.4 - 6.5 K/uL   Lymphocytes Relative 30 %   Lymphs Abs 1.7 1.0 - 3.6 K/uL   Monocytes Relative 7 %   Monocytes Absolute 0.4 0.2 - 0.9 K/uL   Eosinophils Relative 2 %   Eosinophils Absolute 0.1 0 - 0.7 K/uL   Basophils Relative 1 %   Basophils Absolute 0.0 0 - 0.1 K/uL      Assessment & Plan:   Problem List Items Addressed This Visit      Cardiovascular and Mediastinum   Migraine    Given significantly increased frequency the past few months, and difficulty to control once they come on, will start some prophylactic agents as well as switch to maxalt as the imitrex is very expensive for her and doesn't seem to work (which may be because she isn't taking enough due to concern about cost). Discussed som prophylactic measures, pt opting to try propranolol and cymbalta. Hoping cymbalta will help moods as well as headaches in the long run. Discussed risks and benefits of both medications. Will give small supply of tramadol as she's maxing out on NSAIDs currently. Zofran prn for nausea      Relevant Medications   rizatriptan (MAXALT) 10 MG tablet   DULoxetine (CYMBALTA) 20 MG capsule   traMADol (ULTRAM) 50 MG tablet   propranolol ER (INDERAL LA) 80 MG 24 hr capsule     Other   ADHD - Primary    Recommended switching to vyvanse or concerta for longer acting/more gentle release formula but given her severity of headaches pt is hesitant to try other means at this moment. Will allow for  occasional BID prn dosing on days she needs much longer action. Pt aware to take as infrequently as possible and side effects to watch for.        Other Visit Diagnoses    Otitis media with effusion, right       Sudafed and flonase to help reduce fluid burden in middle ear and hopefully reduce pain and pressure  sxs.        Follow up plan: Return in about 6 weeks (around 08/18/2017) for Migraines, moods.

## 2017-07-09 ENCOUNTER — Encounter: Payer: Self-pay | Admitting: Family Medicine

## 2017-07-09 DIAGNOSIS — G43909 Migraine, unspecified, not intractable, without status migrainosus: Secondary | ICD-10-CM | POA: Insufficient documentation

## 2017-07-09 NOTE — Assessment & Plan Note (Signed)
Given significantly increased frequency the past few months, and difficulty to control once they come on, will start some prophylactic agents as well as switch to maxalt as the imitrex is very expensive for her and doesn't seem to work (which may be because she isn't taking enough due to concern about cost). Discussed som prophylactic measures, pt opting to try propranolol and cymbalta. Hoping cymbalta will help moods as well as headaches in the long run. Discussed risks and benefits of both medications. Will give small supply of tramadol as she's maxing out on NSAIDs currently. Zofran prn for nausea

## 2017-07-09 NOTE — Assessment & Plan Note (Signed)
Recommended switching to vyvanse or concerta for longer acting/more gentle release formula but given her severity of headaches pt is hesitant to try other means at this moment. Will allow for occasional BID prn dosing on days she needs much longer action. Pt aware to take as infrequently as possible and side effects to watch for.

## 2017-07-21 ENCOUNTER — Encounter: Payer: Self-pay | Admitting: Family Medicine

## 2017-07-23 ENCOUNTER — Ambulatory Visit
Admission: RE | Admit: 2017-07-23 | Discharge: 2017-07-23 | Disposition: A | Discharge status: Home / Self Care | Payer: BLUE CROSS/BLUE SHIELD | Source: Ambulatory Visit | Attending: Family Medicine | Admitting: Family Medicine

## 2017-07-23 ENCOUNTER — Other Ambulatory Visit: Payer: Self-pay | Admitting: Family Medicine

## 2017-07-23 DIAGNOSIS — G8929 Other chronic pain: Secondary | ICD-10-CM

## 2017-07-23 DIAGNOSIS — M25561 Pain in right knee: Principal | ICD-10-CM

## 2017-07-26 ENCOUNTER — Ambulatory Visit: Admission: RE | Admit: 2017-07-26 | Payer: BLUE CROSS/BLUE SHIELD | Source: Ambulatory Visit

## 2017-07-27 ENCOUNTER — Ambulatory Visit
Admission: RE | Admit: 2017-07-27 | Discharge: 2017-07-27 | Disposition: A | Discharge status: Home / Self Care | Payer: BLUE CROSS/BLUE SHIELD | Source: Ambulatory Visit | Attending: Family Medicine | Admitting: Family Medicine

## 2017-07-27 DIAGNOSIS — M25561 Pain in right knee: Secondary | ICD-10-CM | POA: Diagnosis not present

## 2017-07-27 DIAGNOSIS — G8929 Other chronic pain: Secondary | ICD-10-CM | POA: Diagnosis not present

## 2017-07-28 ENCOUNTER — Encounter: Payer: Self-pay | Admitting: Family Medicine

## 2017-07-29 ENCOUNTER — Other Ambulatory Visit: Payer: Self-pay | Admitting: Family Medicine

## 2017-07-29 MED ORDER — BUPROPION HCL ER (XL) 300 MG PO TB24: 300.0000 mg | ORAL_TABLET | Freq: Every day | ORAL | 1 refills | Status: DC

## 2017-08-06 ENCOUNTER — Encounter: Payer: Self-pay | Admitting: Obstetrics and Gynecology

## 2017-08-12 ENCOUNTER — Encounter: Payer: Self-pay | Admitting: Obstetrics and Gynecology

## 2017-08-12 ENCOUNTER — Other Ambulatory Visit (INDEPENDENT_AMBULATORY_CARE_PROVIDER_SITE_OTHER): Payer: BLUE CROSS/BLUE SHIELD

## 2017-08-12 ENCOUNTER — Ambulatory Visit (INDEPENDENT_AMBULATORY_CARE_PROVIDER_SITE_OTHER): Payer: BLUE CROSS/BLUE SHIELD | Admitting: Obstetrics and Gynecology

## 2017-08-12 VITALS — BP 128/75 | HR 55 | Wt 146.7 lb

## 2017-08-12 DIAGNOSIS — Z30433 Encounter for removal and reinsertion of intrauterine contraceptive device: Secondary | ICD-10-CM

## 2017-08-12 DIAGNOSIS — R102 Pelvic and perineal pain: Secondary | ICD-10-CM

## 2017-08-12 NOTE — Progress Notes (Signed)
Regina [name] is a 37 y.o. year old G77P3 Caucasian female who presents for removal and replacement of a Mirena IUD. She was given informed consent for removal and reinsertion of her Mirena. Her Mirena was placed 08/02/2014, No LMP recorded. (Menstrual status: IUD)., and her pregnancy test today was negative.   The risks and benefits of the method and placement have been thouroughly reviewed with the patient and all questions were answered.  Specifically the patient is aware of failure rate of 05/998, expulsion of the IUD and of possible perforation.  The patient is aware of irregular bleeding due to the method and understands the incidence of irregular bleeding diminishes with time.  Signed copy of informed consent in chart.   No LMP recorded. (Menstrual status: IUD). BP 128/75   Pulse (!) 55   Wt 146 lb 11.2 oz (66.5 kg)   BMI 22.98 kg/m  No results found for this or any previous visit (from the past 24 hour(s)).   Appropriate time out taken. A graves speculum was placed in the vagina.  The cervix was visualized, prepped using Betadine. The strings were visible. They were grasped and the Mirena was easily removed. The cervix was then grasped with a single-tooth tenaculum. The uterus was found to be neutral and it sounded to 8 cm.  Mirena IUD placed per manufacturer's recommendations without complications. The strings were trimmed to 3 cm.  The patient tolerated the procedure well.   Sonogram was performed and the proper placement of the IUD was verified via transvaginal u/s.  Ultrasound findings:  Findings:  The uterus measures 8.0 x 4.6 x 3.7 cm. Echo texture is homogeneous without evidence of focal masses. The Endometrium measures 2.2 mm. The IUD appears to be in the correct location within the endometrium.  Right Ovary measures 3.6 x 2.5 x 2.5 cm and contains a dominant follicle measuring 2.2 x 2.0 x 2.0 cm.  Left Ovary measures 3.0 x 2.2 x 1.7 cm. It is normal appearance. Survey of  the adnexa demonstrates no adnexal masses. There is free fluid in the anterior and posterior cul de sac.  Impression: 1. Anteverted uterus appears of normal size and contour. 2. The endometrium measures 2.2 mm. 3. The IUD appears to be in the correct location within the endometrium. 4. Right ovary contains a dominant follicle measures 2.2 x 2.0 x 2.0 cm. 5. Small amount of free fluid noted in the anterior and posterior cul de sac.  The patient was given post procedure instructions, including signs and symptoms of infection and to check for the strings after each menses or each month, and refraining from intercourse or anything in the vagina for 3 days.  She was given a Mirena care card with date Mirena placed, and date Mirena to be removed.    Tymier Lindholm Rockney Ghee, CNM

## 2017-08-18 ENCOUNTER — Ambulatory Visit (INDEPENDENT_AMBULATORY_CARE_PROVIDER_SITE_OTHER): Payer: BLUE CROSS/BLUE SHIELD | Admitting: Family Medicine

## 2017-08-18 ENCOUNTER — Encounter: Payer: Self-pay | Admitting: Family Medicine

## 2017-08-18 VITALS — BP 129/84 | HR 75 | Temp 98.1°F | Ht 67.0 in | Wt 143.1 lb

## 2017-08-18 DIAGNOSIS — F909 Attention-deficit hyperactivity disorder, unspecified type: Secondary | ICD-10-CM

## 2017-08-18 DIAGNOSIS — F330 Major depressive disorder, recurrent, mild: Secondary | ICD-10-CM | POA: Diagnosis not present

## 2017-08-18 DIAGNOSIS — G43909 Migraine, unspecified, not intractable, without status migrainosus: Secondary | ICD-10-CM | POA: Diagnosis not present

## 2017-08-18 MED ORDER — RIZATRIPTAN BENZOATE 10 MG PO TABS: 10.0000 mg | ORAL_TABLET | ORAL | 2 refills | Status: DC | PRN

## 2017-08-18 MED ORDER — PROPRANOLOL HCL ER 80 MG PO CP24: 80.0000 mg | ORAL_CAPSULE | Freq: Every day | ORAL | 1 refills | Status: DC

## 2017-08-18 MED ORDER — LISDEXAMFETAMINE DIMESYLATE 30 MG PO CAPS: 30.0000 mg | ORAL_CAPSULE | Freq: Every day | ORAL | 0 refills | Status: DC

## 2017-08-18 NOTE — Progress Notes (Signed)
BP 129/84   Pulse 75   Temp 98.1 F (36.7 C) (Oral)   Ht 5\' 7"  (1.702 m)   Wt 143 lb 1.6 oz (64.9 kg)   SpO2 100%   BMI 22.41 kg/m    Subjective:    Patient ID: Regina [name], female    DOB: 01-15-1981, 37 y.o.   MRN: [id number]  HPI: Regina [name] is a 37 y.o. female  Chief Complaint  Patient presents with  . Mood  . Migraine   Pt here today for mood and migraine f/u. Was started on cymbalta and propranolol previously. Switched several weeks ago from cymbalta to wellbutrin given sedation side effects with cymbalta. Has had a terrible week life stress wise the past week [name] unsure how accurately she can gauge how well the change has gone. No side effects reported from wellbutrin, feels it may be helping moods some.   Minimal HAs since starting propranolol, pt very pleased with how that's going. Was unable to afford the maxalt [name] has been treating breakthrough migraines with OTC pain relievers and rest. No dizziness, bradycardia, CP, SOB.   Pt feels her ritalin is not strong enough or lasting long enough. Feels focus continues to struggle, ready to try some of the suggestions from prior visit for longer acting medications.   Past Medical History:  Diagnosis Date  . Cancer (HCC)    cervical cancer  . Chronic idiopathic constipation   . Depression   . Headache    migraines  . Kidney stone   . Tubal pregnancy    Social History   Socioeconomic History  . Marital status: Single    Spouse name: Not on file  . Number of children: Not on file  . Years of education: Not on file  . Highest education level: Not on file  Occupational History  . Not on file  Social Needs  . Financial resource strain: Not on file  . Food insecurity:    Worry: Not on file    Inability: Not on file  . Transportation needs:    Medical: Not on file    Non-medical: Not on file  Tobacco Use  . Smoking status: Never Smoker  . Smokeless tobacco: Never Used  Substance and Sexual Activity    . Alcohol use: Yes    Comment: on occasion  . Drug use: No  . Sexual activity: Yes    Birth control/protection: Surgical    Comment: mirena  Lifestyle  . Physical activity:    Days per week: Not on file    Minutes per session: Not on file  . Stress: Not on file  Relationships  . Social connections:    Talks on phone: Not on file    Gets together: Not on file    Attends religious service: Not on file    Active member of club or organization: Not on file    Attends meetings of clubs or organizations: Not on file    Relationship status: Not on file  . Intimate partner violence:    Fear of current or ex partner: Not on file    Emotionally abused: Not on file    Physically abused: Not on file    Forced sexual activity: Not on file  Other Topics Concern  . Not on file  Social History Narrative  . Not on file   Relevant past medical, surgical, family and social history reviewed and updated as indicated. Interim medical history since our last visit reviewed.  Allergies and medications reviewed and updated.  Review of Systems  Per HPI unless specifically indicated above     Objective:    BP 129/84   Pulse 75   Temp 98.1 F (36.7 C) (Oral)   Ht 5\' 7"  (1.702 m)   Wt 143 lb 1.6 oz (64.9 kg)   SpO2 100%   BMI 22.41 kg/m   Wt Readings from Last 3 Encounters:  08/18/17 143 lb 1.6 oz (64.9 kg)  08/12/17 146 lb 11.2 oz (66.5 kg)  07/07/17 145 lb 6.4 oz (66 kg)    Physical Exam  Constitutional: She is oriented to person, place, and time. She appears well-developed and well-nourished. No distress.  HENT:  Head: Atraumatic.  Eyes: Pupils are equal, round, and reactive to light. Conjunctivae and EOM are normal.  Neck: Normal range of motion. Neck supple.  Cardiovascular: Normal rate and regular rhythm.  Pulmonary/Chest: Effort normal and breath sounds normal.  Musculoskeletal: Normal range of motion.  Neurological: She is alert and oriented to person, place, and time. No  cranial nerve deficit.  Skin: Skin is warm and dry.  Psychiatric: She has a normal mood and affect. Her behavior is normal. Judgment and thought content normal.    Results for orders placed or performed during the hospital encounter of 03/25/17  CBC with Differential/Platelet  Result Value Ref Range   WBC 5.6 3.6 - 11.0 K/uL   RBC 4.60 3.80 - 5.20 MIL/uL   Hemoglobin 13.9 12.0 - 16.0 g/dL   HCT 41.6 35.0 - 47.0 %   MCV 90.5 80.0 - 100.0 fL   MCH 30.2 26.0 - 34.0 pg   MCHC 33.4 32.0 - 36.0 g/dL   RDW 13.2 11.5 - 14.5 %   Platelets 257 150 - 440 K/uL   Neutrophils Relative % 60 %   Neutro Abs 3.4 1.4 - 6.5 K/uL   Lymphocytes Relative 30 %   Lymphs Abs 1.7 1.0 - 3.6 K/uL   Monocytes Relative 7 %   Monocytes Absolute 0.4 0.2 - 0.9 K/uL   Eosinophils Relative 2 %   Eosinophils Absolute 0.1 0 - 0.7 K/uL   Basophils Relative 1 %   Basophils Absolute 0.0 0 - 0.1 K/uL      Assessment & Plan:   Problem List Items Addressed This Visit      Cardiovascular and Mediastinum   Migraine - Primary    Significant improvement with the addition of propranolol. Did not tolerate cymbalta well [name] d/c'd that. Will get coupon [name] maxalt is affordable for prn use for breakthrough migraines. Continue to monitor closely      Relevant Medications   propranolol ER (INDERAL LA) 80 MG 24 hr capsule   rizatriptan (MAXALT) 10 MG tablet     Other   ADHD    D/c ritalin, will switch to vyvanse for longer mechanism of action and monitor for benefit. Risks and side effects reviewed      Depression    Side effects with cymbalta, mostly sedation. Feels fairly improved [name] far on wellbutrin 300 mg, wanting to give that more time to see how it does longer term. Continue this regimen          Follow up plan: Return in about 1 month (around 09/15/2017) for Mood, migraine, focus  f/u.

## 2017-08-20 DIAGNOSIS — F32A Depression, unspecified: Secondary | ICD-10-CM | POA: Insufficient documentation

## 2017-08-20 DIAGNOSIS — F329 Major depressive disorder, single episode, unspecified: Secondary | ICD-10-CM | POA: Insufficient documentation

## 2017-08-20 NOTE — Assessment & Plan Note (Signed)
Side effects with cymbalta, mostly sedation. Feels fairly improved so far on wellbutrin 300 mg, wanting to give that more time to see how it does longer term. Continue this regimen

## 2017-08-20 NOTE — Patient Instructions (Addendum)
Follow up in 1 month   

## 2017-08-20 NOTE — Assessment & Plan Note (Signed)
Significant improvement with the addition of propranolol. Did not tolerate cymbalta well so d/c'd that. Will get coupon so maxalt is affordable for prn use for breakthrough migraines. Continue to monitor closely

## 2017-08-20 NOTE — Assessment & Plan Note (Signed)
D/c ritalin, will switch to vyvanse for longer mechanism of action and monitor for benefit. Risks and side effects reviewed

## 2017-08-26 ENCOUNTER — Encounter: Payer: Self-pay | Admitting: Family Medicine

## 2017-09-02 ENCOUNTER — Other Ambulatory Visit: Payer: Self-pay | Admitting: Family Medicine

## 2017-09-02 MED ORDER — METHYLPHENIDATE HCL ER 20 MG PO TBCR
20.0000 mg | EXTENDED_RELEASE_TABLET | Freq: Every day | ORAL | 0 refills | Status: DC
Start: 2017-09-02 — End: 2017-10-07

## 2017-09-16 DIAGNOSIS — D239 Other benign neoplasm of skin, unspecified: Secondary | ICD-10-CM

## 2017-09-16 DIAGNOSIS — L578 Other skin changes due to chronic exposure to nonionizing radiation: Secondary | ICD-10-CM | POA: Diagnosis not present

## 2017-09-16 DIAGNOSIS — D2272 Melanocytic nevi of left lower limb, including hip: Secondary | ICD-10-CM | POA: Diagnosis not present

## 2017-09-16 DIAGNOSIS — Z1283 Encounter for screening for malignant neoplasm of skin: Secondary | ICD-10-CM | POA: Diagnosis not present

## 2017-09-16 DIAGNOSIS — L720 Epidermal cyst: Secondary | ICD-10-CM | POA: Diagnosis not present

## 2017-09-16 DIAGNOSIS — D2261 Melanocytic nevi of right upper limb, including shoulder: Secondary | ICD-10-CM | POA: Diagnosis not present

## 2017-09-16 DIAGNOSIS — D225 Melanocytic nevi of trunk: Secondary | ICD-10-CM | POA: Diagnosis not present

## 2017-09-16 DIAGNOSIS — D485 Neoplasm of uncertain behavior of skin: Secondary | ICD-10-CM | POA: Diagnosis not present

## 2017-09-16 HISTORY — DX: Other benign neoplasm of skin, unspecified: D23.9

## 2017-09-22 ENCOUNTER — Ambulatory Visit: Payer: BLUE CROSS/BLUE SHIELD | Admitting: Family Medicine

## 2017-09-23 ENCOUNTER — Other Ambulatory Visit: Payer: Self-pay | Admitting: Family Medicine

## 2017-09-24 NOTE — Telephone Encounter (Signed)
Pt scheduled and appt for 10/07/17 and would like to know if she could have meds called in to [facility] to last until that appt.

## 2017-09-24 NOTE — Telephone Encounter (Signed)
Inderal LA 80 mg 24 hr refill request  LOV 08/18/17 with Merrie Roof  Was for a 4 wk F/U but appt on 09/22/17 cancelled by pt No future appts noted  [facility] [id number] [address], [place] - [address]

## 2017-09-24 NOTE — Telephone Encounter (Signed)
Completed per note

## 2017-10-07 ENCOUNTER — Ambulatory Visit (INDEPENDENT_AMBULATORY_CARE_PROVIDER_SITE_OTHER): Payer: BLUE CROSS/BLUE SHIELD | Admitting: Family Medicine

## 2017-10-07 ENCOUNTER — Encounter: Payer: Self-pay | Admitting: Family Medicine

## 2017-10-07 VITALS — BP 118/79 | HR 78 | Wt 142.6 lb

## 2017-10-07 DIAGNOSIS — F330 Major depressive disorder, recurrent, mild: Secondary | ICD-10-CM

## 2017-10-07 DIAGNOSIS — G43909 Migraine, unspecified, not intractable, without status migrainosus: Secondary | ICD-10-CM | POA: Diagnosis not present

## 2017-10-07 DIAGNOSIS — F909 Attention-deficit hyperactivity disorder, unspecified type: Secondary | ICD-10-CM | POA: Diagnosis not present

## 2017-10-07 DIAGNOSIS — H00014 Hordeolum externum left upper eyelid: Secondary | ICD-10-CM | POA: Diagnosis not present

## 2017-10-07 MED ORDER — PROPRANOLOL HCL ER 80 MG PO CP24: 80.0000 mg | ORAL_CAPSULE | Freq: Every day | ORAL | 1 refills | Status: DC

## 2017-10-07 MED ORDER — LISDEXAMFETAMINE DIMESYLATE 30 MG PO CAPS: 30.0000 mg | ORAL_CAPSULE | Freq: Every day | ORAL | 0 refills | Status: DC

## 2017-10-07 MED ORDER — ERYTHROMYCIN 5 MG/GM OP OINT: 1.0000 "application " | TOPICAL_OINTMENT | Freq: Every day | OPHTHALMIC | 0 refills | Status: DC

## 2017-10-07 MED ORDER — BUPROPION HCL ER (XL) 300 MG PO TB24: 300.0000 mg | ORAL_TABLET | Freq: Every day | ORAL | 1 refills | Status: DC

## 2017-10-07 NOTE — Assessment & Plan Note (Signed)
Doing well. Would like to go back on her vyvanse. Sent to her Rx. 3 month supply given. Follow up 3 months.

## 2017-10-07 NOTE — Progress Notes (Signed)
BP 118/79   Pulse 78   Wt 142 lb 9.6 oz (64.7 kg)   SpO2 100%   BMI 22.33 kg/m    Subjective:    Patient ID: Regina [name], female    DOB: 1981/03/31, 37 y.o.   MRN: [id number]  HPI: Regina [name] is a 37 y.o. female  Chief Complaint  Patient presents with  . Medication Refill    ADD  . Belepharitis   ADHD FOLLOW UP ADHD status: controlled Satisfied with current therapy: yes Medication compliance:  excellent compliance Controlled substance contract: yes Previous psychiatry evaluation: no Previous medications: yes daytrana (methylphenidate) and vyvanse (lisdexamfethamine)   Taking meds on weekends/vacations: yes Work/school performance:  good Difficulty sustaining attention/completing tasks: no Distracted by extraneous stimuli: no Does not listen when spoken to: no  Fidgets with hands or feet: no Unable to stay in seat: no Blurts out/interrupts others: no ADHD Medication Side Effects: no    Decreased appetite: no    Headache: no    Sleeping disturbance pattern: no    Irritability: no    Rebound effects (worse than baseline) off medication: no    Anxiousness: no    Dizziness: no    Tics: no  EYE IRRITATION Duration:  3 days Involved eye:  left Onset: gradual Severity: mild  Quality: irritated Foreign body sensation:yes Visual impairment: no Eye redness: no Discharge: no Crusting or matting of eyelids: no Swelling: no Photophobia: no Itching: yes Tearing: no Headache: no Floaters: no URI symptoms: no Contact lens use: no Close contacts with similar problems: no Eye trauma: no Status: stable   Relevant past medical, surgical, family and social history reviewed and updated as indicated. Interim medical history since our last visit reviewed. Allergies and medications reviewed and updated.  Review of Systems  Constitutional: Negative.   Eyes: Positive for itching. Negative for photophobia, pain, discharge, redness and visual disturbance.    Respiratory: Negative.   Cardiovascular: Negative.   Psychiatric/Behavioral: Negative.     Per HPI unless specifically indicated above     Objective:    BP 118/79   Pulse 78   Wt 142 lb 9.6 oz (64.7 kg)   SpO2 100%   BMI 22.33 kg/m   Wt Readings from Last 3 Encounters:  10/07/17 142 lb 9.6 oz (64.7 kg)  08/18/17 143 lb 1.6 oz (64.9 kg)  08/12/17 146 lb 11.2 oz (66.5 kg)    Physical Exam  Constitutional: She is oriented to person, place, and time. She appears well-developed and well-nourished. No distress.  HENT:  Head: Normocephalic and atraumatic.  Right Ear: Hearing normal.  Left Ear: Hearing normal.  Nose: Nose normal.  Eyes: Pupils are equal, round, and reactive to light. Conjunctivae, EOM and lids are normal. Right eye exhibits no discharge. Left eye exhibits no discharge. No scleral icterus.  Neck:  Stye upper lid of L eye  Cardiovascular: Normal rate, regular rhythm, normal heart sounds and intact distal pulses. Exam reveals no gallop and no friction rub.  No murmur heard. Pulmonary/Chest: Effort normal and breath sounds normal. No stridor. No respiratory distress. She has no wheezes. She has no rales. She exhibits no tenderness.  Musculoskeletal: Normal range of motion.  Neurological: She is alert and oriented to person, place, and time.  Skin: Skin is warm, dry and intact. Capillary refill takes less than 2 seconds. No rash noted. She is not diaphoretic. No erythema. No pallor.  Psychiatric: She has a normal mood and affect. Her speech is normal  and behavior is normal. Judgment and thought content normal. Cognition and memory are normal.    Results for orders placed or performed during the hospital encounter of 03/25/17  CBC with Differential/Platelet  Result Value Ref Range   WBC 5.6 3.6 - 11.0 K/uL   RBC 4.60 3.80 - 5.20 MIL/uL   Hemoglobin 13.9 12.0 - 16.0 g/dL   HCT 41.6 35.0 - 47.0 %   MCV 90.5 80.0 - 100.0 fL   MCH 30.2 26.0 - 34.0 pg   MCHC 33.4 32.0  - 36.0 g/dL   RDW 13.2 11.5 - 14.5 %   Platelets 257 150 - 440 K/uL   Neutrophils Relative % 60 %   Neutro Abs 3.4 1.4 - 6.5 K/uL   Lymphocytes Relative 30 %   Lymphs Abs 1.7 1.0 - 3.6 K/uL   Monocytes Relative 7 %   Monocytes Absolute 0.4 0.2 - 0.9 K/uL   Eosinophils Relative 2 %   Eosinophils Absolute 0.1 0 - 0.7 K/uL   Basophils Relative 1 %   Basophils Absolute 0.0 0 - 0.1 K/uL      Assessment & Plan:   Problem List Items Addressed This Visit      Cardiovascular and Mediastinum   Migraine    Doing well on current regimen. Call with any concerns. Refills given.       Relevant Medications   buPROPion (WELLBUTRIN XL) 300 MG 24 hr tablet   propranolol ER (INDERAL LA) 80 MG 24 hr capsule     Other   ADHD - Primary    Doing well. Would like to go back on her vyvanse. Sent to her Rx. 3 month supply given. Follow up 3 months.       Depression    Doing well on current regimen. Call with any concerns. Refills given.       Relevant Medications   buPROPion (WELLBUTRIN XL) 300 MG 24 hr tablet    Other Visit Diagnoses    Hordeolum externum of left upper eyelid       Will treat with erythromycin and warm compresses. Call with any concerns.        Follow up plan: Return in about 3 months (around 01/07/2018) for ADD follow up.

## 2017-10-07 NOTE — Assessment & Plan Note (Signed)
Doing well on current regimen. Call with any concerns. Refills given.

## 2017-10-07 NOTE — Patient Instructions (Signed)

## 2017-10-21 DIAGNOSIS — M84375A Stress fracture, left foot, initial encounter for fracture: Secondary | ICD-10-CM | POA: Diagnosis not present

## 2017-11-04 DIAGNOSIS — M79672 Pain in left foot: Secondary | ICD-10-CM | POA: Diagnosis not present

## 2017-12-07 ENCOUNTER — Other Ambulatory Visit: Payer: Self-pay | Admitting: Obstetrics and Gynecology

## 2017-12-07 DIAGNOSIS — Z808 Family history of malignant neoplasm of other organs or systems: Secondary | ICD-10-CM | POA: Diagnosis not present

## 2017-12-07 DIAGNOSIS — D2261 Melanocytic nevi of right upper limb, including shoulder: Secondary | ICD-10-CM | POA: Diagnosis not present

## 2017-12-07 DIAGNOSIS — D485 Neoplasm of uncertain behavior of skin: Secondary | ICD-10-CM | POA: Diagnosis not present

## 2017-12-07 DIAGNOSIS — D225 Melanocytic nevi of trunk: Secondary | ICD-10-CM | POA: Diagnosis not present

## 2017-12-09 ENCOUNTER — Other Ambulatory Visit: Payer: Self-pay | Admitting: Family Medicine

## 2017-12-09 NOTE — Telephone Encounter (Signed)
Patient notified that she should still have another prescription at her pharmacy.

## 2017-12-14 DIAGNOSIS — D485 Neoplasm of uncertain behavior of skin: Secondary | ICD-10-CM | POA: Diagnosis not present

## 2018-01-04 DIAGNOSIS — D485 Neoplasm of uncertain behavior of skin: Secondary | ICD-10-CM | POA: Diagnosis not present

## 2018-01-04 DIAGNOSIS — L430 Hypertrophic lichen planus: Secondary | ICD-10-CM | POA: Diagnosis not present

## 2018-01-07 ENCOUNTER — Ambulatory Visit: Payer: BLUE CROSS/BLUE SHIELD | Admitting: Family Medicine

## 2018-01-07 ENCOUNTER — Other Ambulatory Visit: Payer: Self-pay | Admitting: Family Medicine

## 2018-01-07 NOTE — Telephone Encounter (Signed)
Maxalt 10 mg tab  refill Last Refill:08/18/17 # 9 with 2 refills Last OV: 10/07/17 PCP: Merrie Roof, [place] [phone]   Please review.

## 2018-01-07 NOTE — Telephone Encounter (Signed)
Pt called in and stated that she was advised she didn't have any refills but showing on her mychart that she does. She is trying to get meds before the weekend

## 2018-01-12 ENCOUNTER — Encounter: Payer: Self-pay | Admitting: Family Medicine

## 2018-01-12 ENCOUNTER — Ambulatory Visit (INDEPENDENT_AMBULATORY_CARE_PROVIDER_SITE_OTHER): Payer: BLUE CROSS/BLUE SHIELD | Admitting: Family Medicine

## 2018-01-12 VITALS — BP 111/76 | HR 87 | Temp 97.8°F | Ht 67.0 in | Wt 134.9 lb

## 2018-01-12 DIAGNOSIS — F909 Attention-deficit hyperactivity disorder, unspecified type: Secondary | ICD-10-CM | POA: Diagnosis not present

## 2018-01-12 DIAGNOSIS — Z862 Personal history of diseases of the blood and blood-forming organs and certain disorders involving the immune mechanism: Secondary | ICD-10-CM

## 2018-01-12 DIAGNOSIS — F330 Major depressive disorder, recurrent, mild: Secondary | ICD-10-CM

## 2018-01-12 DIAGNOSIS — R5383 Other fatigue: Secondary | ICD-10-CM | POA: Diagnosis not present

## 2018-01-12 MED ORDER — LISDEXAMFETAMINE DIMESYLATE 40 MG PO CAPS: 40.0000 mg | ORAL_CAPSULE | ORAL | 0 refills | Status: DC

## 2018-01-12 NOTE — Progress Notes (Signed)
BP 111/76 (BP Location: Left Arm, Patient Position: Sitting, Cuff Size: Normal)   Pulse 87   Temp 97.8 F (36.6 C) (Oral)   Ht 5\' 7"  (1.702 m)   Wt 134 lb 14.4 oz (61.2 kg)   SpO2 100%   BMI 21.13 kg/m    Subjective:    Patient ID: Regina [name], female    DOB: 02-07-1981, 37 y.o.   MRN: [id number]  HPI: Regina [name] is a 37 y.o. female  Chief Complaint  Patient presents with  . ADHD    3 month follow up  . Medication Management    Patient would like to discuss going off of bupropion.   Here today for 3 month f/u.  Felt like her emotions were very muted on the 300 mg dose of wellbutrin. Went back to he 75 mg but doesn't feel like it was helping. Stopped altogether about 10 days ago. Does not feel like she needs to be back on it. Denies SI/HI, severe mood swings, sleep or appetite issues.   Does like the vyvanse, but does not feel like it's strong enough. Feels like her energy level plummets . Has had issues with ritalin and adderall in the past with highs and lows in moods and migraines.   Having ongoing fatigue sxs. Wanting vitamin levels and other labs checked. Job is very stressful and she thinks this plays a large role. Denies fevers, chills, night sweats, weight loss.   Past Medical History:  Diagnosis Date  . Cancer (HCC)    cervical cancer  . Chronic idiopathic constipation   . Depression   . Headache    migraines  . Kidney stone   . Skin cancer   . Tubal pregnancy    Social History   Socioeconomic History  . Marital status: Single    Spouse name: Not on file  . Number of children: Not on file  . Years of education: Not on file  . Highest education level: Not on file  Occupational History  . Not on file  Social Needs  . Financial resource strain: Not on file  . Food insecurity:    Worry: Not on file    Inability: Not on file  . Transportation needs:    Medical: Not on file    Non-medical: Not on file  Tobacco Use  . Smoking status: Never  Smoker  . Smokeless tobacco: Never Used  Substance and Sexual Activity  . Alcohol use: Yes    Comment: on occasion  . Drug use: No  . Sexual activity: Yes    Birth control/protection: Surgical    Comment: mirena  Lifestyle  . Physical activity:    Days per week: Not on file    Minutes per session: Not on file  . Stress: Not on file  Relationships  . Social connections:    Talks on phone: Not on file    Gets together: Not on file    Attends religious service: Not on file    Active member of club or organization: Not on file    Attends meetings of clubs or organizations: Not on file    Relationship status: Not on file  . Intimate partner violence:    Fear of current or ex partner: Not on file    Emotionally abused: Not on file    Physically abused: Not on file    Forced sexual activity: Not on file  Other Topics Concern  . Not on file  Social History  Narrative  . Not on file    Relevant past medical, surgical, family and social history reviewed and updated as indicated. Interim medical history since our last visit reviewed. Allergies and medications reviewed and updated.  Review of Systems  Per HPI unless specifically indicated above     Objective:    BP 111/76 (BP Location: Left Arm, Patient Position: Sitting, Cuff Size: Normal)   Pulse 87   Temp 97.8 F (36.6 C) (Oral)   Ht 5\' 7"  (1.702 m)   Wt 134 lb 14.4 oz (61.2 kg)   SpO2 100%   BMI 21.13 kg/m   Wt Readings from Last 3 Encounters:  01/12/18 134 lb 14.4 oz (61.2 kg)  10/07/17 142 lb 9.6 oz (64.7 kg)  08/18/17 143 lb 1.6 oz (64.9 kg)    Physical Exam  Constitutional: She is oriented to person, place, and time. She appears well-developed and well-nourished. No distress.  HENT:  Head: Atraumatic.  Eyes: Conjunctivae and EOM are normal.  Neck: Normal range of motion. Neck supple. No thyromegaly present.  Cardiovascular: Normal rate and regular rhythm.  Pulmonary/Chest: Effort normal and breath sounds  normal.  Musculoskeletal: Normal range of motion.  Lymphadenopathy:    She has no cervical adenopathy.  Neurological: She is alert and oriented to person, place, and time.  Skin: Skin is warm and dry.  Psychiatric: She has a normal mood and affect. Her behavior is normal.  Nursing note and vitals reviewed.   Results for orders placed or performed in visit on 01/12/18  CBC with Differential/Platelet  Result Value Ref Range   WBC 6.2 3.4 - 10.8 x10E3/uL   RBC 4.78 3.77 - 5.28 x10E6/uL   Hemoglobin 14.9 11.1 - 15.9 g/dL   Hematocrit 44.0 34.0 - 46.6 %   MCV 92 79 - 97 fL   MCH 31.2 26.6 - 33.0 pg   MCHC 33.9 31.5 - 35.7 g/dL   RDW 12.0 (L) 12.3 - 15.4 %   Platelets 301 150 - 450 x10E3/uL   Neutrophils 65 Not Estab. %   Lymphs 26 Not Estab. %   Monocytes 7 Not Estab. %   Eos 1 Not Estab. %   Basos 1 Not Estab. %   Neutrophils Absolute 4.1 1.4 - 7.0 x10E3/uL   Lymphocytes Absolute 1.6 0.7 - 3.1 x10E3/uL   Monocytes Absolute 0.4 0.1 - 0.9 x10E3/uL   EOS (ABSOLUTE) 0.1 0.0 - 0.4 x10E3/uL   Basophils Absolute 0.0 0.0 - 0.2 x10E3/uL   Immature Granulocytes 0 Not Estab. %   Immature Grans (Abs) 0.0 0.0 - 0.1 x10E3/uL  TSH  Result Value Ref Range   TSH 1.420 0.450 - 4.500 uIU/mL  Vitamin D (25 hydroxy)  Result Value Ref Range   Vit D, 25-Hydroxy 72.2 30.0 - 100.0 ng/mL  Vitamin B12  Result Value Ref Range   Vitamin B-12 712 232 - 1,245 pg/mL      Assessment & Plan:   Problem List Items Addressed This Visit      Other   ADHD - Primary    Increase dose to 40 mg vyvanse, check in via mychart at end of rx.       Depression    Doing fairly well off medication, wanting to try staying off for now. Follow up if moods worsening       Other Visit Diagnoses    History of anemia       Relevant Orders   CBC with Differential/Platelet (Completed)   Fatigue, unspecified type  Will check basic labs and work on sleep hygiene, stress management. Regular exercise, good diet    Relevant Orders   TSH (Completed)   Vitamin D (25 hydroxy) (Completed)   Vitamin B12 (Completed)       Follow up plan: Return in about 3 months (around 04/13/2018) for ADHD.

## 2018-01-13 LAB — CBC WITH DIFFERENTIAL/PLATELET
BASOS: 1 %
Basophils Absolute: 0 10*3/uL (ref 0.0–0.2)
EOS (ABSOLUTE): 0.1 10*3/uL (ref 0.0–0.4)
EOS: 1 %
HEMATOCRIT: 44 % (ref 34.0–46.6)
Hemoglobin: 14.9 g/dL (ref 11.1–15.9)
Immature Grans (Abs): 0 10*3/uL (ref 0.0–0.1)
Immature Granulocytes: 0 %
LYMPHS ABS: 1.6 10*3/uL (ref 0.7–3.1)
Lymphs: 26 %
MCH: 31.2 pg (ref 26.6–33.0)
MCHC: 33.9 g/dL (ref 31.5–35.7)
MCV: 92 fL (ref 79–97)
Monocytes Absolute: 0.4 10*3/uL (ref 0.1–0.9)
Monocytes: 7 %
NEUTROS PCT: 65 %
Neutrophils Absolute: 4.1 10*3/uL (ref 1.4–7.0)
Platelets: 301 10*3/uL (ref 150–450)
RBC: 4.78 x10E6/uL (ref 3.77–5.28)
RDW: 12 % — ABNORMAL LOW (ref 12.3–15.4)
WBC: 6.2 10*3/uL (ref 3.4–10.8)

## 2018-01-13 LAB — VITAMIN D 25 HYDROXY (VIT D DEFICIENCY, FRACTURES): VIT D 25 HYDROXY: 72.2 ng/mL (ref 30.0–100.0)

## 2018-01-13 LAB — VITAMIN B12: Vitamin B-12: 712 pg/mL (ref 232–1245)

## 2018-01-13 LAB — TSH: TSH: 1.42 u[IU]/mL (ref 0.450–4.500)

## 2018-01-17 NOTE — Assessment & Plan Note (Signed)
Increase dose to 40 mg vyvanse, check in via mychart at end of rx.

## 2018-01-17 NOTE — Patient Instructions (Signed)
Follow up in 3 months

## 2018-01-17 NOTE — Assessment & Plan Note (Signed)
Doing fairly well off medication, wanting to try staying off for now. Follow up if moods worsening

## 2018-02-16 ENCOUNTER — Encounter: Payer: Self-pay | Admitting: Family Medicine

## 2018-03-10 DIAGNOSIS — J029 Acute pharyngitis, unspecified: Secondary | ICD-10-CM | POA: Diagnosis not present

## 2018-03-10 DIAGNOSIS — R6889 Other general symptoms and signs: Secondary | ICD-10-CM | POA: Diagnosis not present

## 2018-03-24 ENCOUNTER — Encounter: Payer: Self-pay | Admitting: Family Medicine

## 2018-03-28 ENCOUNTER — Encounter: Payer: Self-pay | Admitting: Family Medicine

## 2018-03-28 ENCOUNTER — Other Ambulatory Visit: Payer: Self-pay | Admitting: Family Medicine

## 2018-03-28 ENCOUNTER — Ambulatory Visit (INDEPENDENT_AMBULATORY_CARE_PROVIDER_SITE_OTHER): Payer: BLUE CROSS/BLUE SHIELD | Admitting: Family Medicine

## 2018-03-28 VITALS — BP 130/80 | HR 67 | Temp 98.3°F | Ht 67.0 in | Wt 140.4 lb

## 2018-03-28 DIAGNOSIS — F909 Attention-deficit hyperactivity disorder, unspecified type: Secondary | ICD-10-CM | POA: Diagnosis not present

## 2018-03-28 DIAGNOSIS — F330 Major depressive disorder, recurrent, mild: Secondary | ICD-10-CM | POA: Diagnosis not present

## 2018-03-28 DIAGNOSIS — Z23 Encounter for immunization: Secondary | ICD-10-CM

## 2018-03-28 DIAGNOSIS — G4700 Insomnia, unspecified: Secondary | ICD-10-CM | POA: Diagnosis not present

## 2018-03-28 DIAGNOSIS — G43909 Migraine, unspecified, not intractable, without status migrainosus: Secondary | ICD-10-CM

## 2018-03-28 MED ORDER — RIZATRIPTAN BENZOATE 10 MG PO TABS: 10.0000 mg | ORAL_TABLET | ORAL | 2 refills | Status: AC | PRN

## 2018-03-28 MED ORDER — METHYLPHENIDATE HCL ER (OSM) 27 MG PO TBCR: 27.0000 mg | EXTENDED_RELEASE_TABLET | ORAL | 0 refills | Status: DC

## 2018-03-28 MED ORDER — TRAZODONE HCL 50 MG PO TABS: 25.0000 mg | ORAL_TABLET | Freq: Every day | ORAL | 0 refills | Status: DC

## 2018-03-28 MED ORDER — BUPROPION HCL ER (SR) 100 MG PO TB12: 100.0000 mg | ORAL_TABLET | Freq: Two times a day (BID) | ORAL | 1 refills | Status: DC

## 2018-03-28 NOTE — Progress Notes (Signed)
BP 130/80 (BP Location: Left Arm, Patient Position: Sitting, Cuff Size: Normal)   Pulse 67   Temp 98.3 F (36.8 C)   Ht 5\' 7"  (1.702 m)   Wt 140 lb 7 oz (63.7 kg)   SpO2 100%   BMI 22.00 kg/m    Subjective:    Patient ID: Regina [name], female    DOB: [birth year], 37 y.o.   MRN: [id number]  HPI: Regina [name] is a 37 y.o. female  Chief Complaint  Patient presents with  . ADHD  . Depression  . Migraine   Restarted her 75 mg wellbutrin 3 weeks ago because she slumped back into lack of motivation, low libido, low desire for socialization. Feeling like this isn't enough of a dose. Has noticed an improvement though. Having a great amount of trouble sleeping on top of everything else.   Having jaw clenching with the 40 mg vyvanse, immediate improvement after d/c. Didn't feel a major benefit with 30 mg vyvanse and concerned about cost as her deductible resets Jan 1. Had migraines worsen on adderall and did not tolerate ritalin well.   Migraines much improved on maxalt. Wanting a greater quantity as she sometimes has been running out. States they are taken care of within 30 min after taking.   Past Medical History:  Diagnosis Date  . Cancer (HCC)    cervical cancer  . Chronic idiopathic constipation   . Depression   . Headache    migraines  . Kidney stone   . Skin cancer   . Tubal pregnancy    Social History   Socioeconomic History  . Marital status: Single    Spouse name: Not on file  . Number of children: Not on file  . Years of education: Not on file  . Highest education level: Not on file  Occupational History  . Not on file  Social Needs  . Financial resource strain: Not on file  . Food insecurity:    Worry: Not on file    Inability: Not on file  . Transportation needs:    Medical: Not on file    Non-medical: Not on file  Tobacco Use  . Smoking status: Never Smoker  . Smokeless tobacco: Never Used  Substance and Sexual Activity  . Alcohol use: Yes    Comment: on occasion  . Drug use: No  . Sexual activity: Yes    Birth control/protection: Surgical    Comment: mirena  Lifestyle  . Physical activity:    Days per week: Not on file    Minutes per session: Not on file  . Stress: Not on file  Relationships  . Social connections:    Talks on phone: Not on file    Gets together: Not on file    Attends religious service: Not on file    Active member of club or organization: Not on file    Attends meetings of clubs or organizations: Not on file    Relationship status: Not on file  . Intimate partner violence:    Fear of current or ex partner: Not on file    Emotionally abused: Not on file    Physically abused: Not on file    Forced sexual activity: Not on file  Other Topics Concern  . Not on file  Social History Narrative  . Not on file   Depression screen [facility] 2/9 03/28/2018 01/12/2018 08/18/2017  Decreased Interest 2 1 0  Down, Depressed, Hopeless 0 0 1  PHQ -  2 Score 2 1 1   Altered sleeping 2 1 1   Tired, decreased energy 2 2 1   Change in appetite 2 0 2  Feeling bad or failure about yourself  0 0 1  Trouble concentrating 3 2 2   Moving slowly or fidgety/restless 0 0 2  Suicidal thoughts 0 0 0  PHQ-9 Score 11 6 10   Difficult doing work/chores Somewhat difficult - -  No flowsheet data found.    Relevant past medical, surgical, family and social history reviewed and updated as indicated. Interim medical history since our last visit reviewed. Allergies and medications reviewed and updated.  Review of Systems  Per HPI unless specifically indicated above     Objective:    BP 130/80 (BP Location: Left Arm, Patient Position: Sitting, Cuff Size: Normal)   Pulse 67   Temp 98.3 F (36.8 C)   Ht 5\' 7"  (1.702 m)   Wt 140 lb 7 oz (63.7 kg)   SpO2 100%   BMI 22.00 kg/m   Wt Readings from Last 3 Encounters:  03/28/18 140 lb 7 oz (63.7 kg)  01/12/18 134 lb 14.4 oz (61.2 kg)  10/07/17 142 lb 9.6 oz (64.7 kg)    Physical  Exam  Constitutional: She is oriented to person, place, and time. She appears well-developed and well-nourished. No distress.  HENT:  Head: Atraumatic.  Eyes: Pupils are equal, round, and reactive to light. Conjunctivae and EOM are normal.  Neck: Normal range of motion. Neck supple.  Cardiovascular: Normal rate, regular rhythm and normal heart sounds.  Pulmonary/Chest: Effort normal and breath sounds normal.  Musculoskeletal: Normal range of motion.  Neurological: She is alert and oriented to person, place, and time. No cranial nerve deficit.  Skin: Skin is warm and dry.  Psychiatric: She has a normal mood and affect. Her behavior is normal. Thought content normal.  Nursing note and vitals reviewed.   Results for orders placed or performed in visit on 01/12/18  CBC with Differential/Platelet  Result Value Ref Range   WBC 6.2 3.4 - 10.8 x10E3/uL   RBC 4.78 3.77 - 5.28 x10E6/uL   Hemoglobin 14.9 11.1 - 15.9 g/dL   Hematocrit 44.0 34.0 - 46.6 %   MCV 92 79 - 97 fL   MCH 31.2 26.6 - 33.0 pg   MCHC 33.9 31.5 - 35.7 g/dL   RDW 12.0 (L) 12.3 - 15.4 %   Platelets 301 150 - 450 x10E3/uL   Neutrophils 65 Not Estab. %   Lymphs 26 Not Estab. %   Monocytes 7 Not Estab. %   Eos 1 Not Estab. %   Basos 1 Not Estab. %   Neutrophils Absolute 4.1 1.4 - 7.0 x10E3/uL   Lymphocytes Absolute 1.6 0.7 - 3.1 x10E3/uL   Monocytes Absolute 0.4 0.1 - 0.9 x10E3/uL   EOS (ABSOLUTE) 0.1 0.0 - 0.4 x10E3/uL   Basophils Absolute 0.0 0.0 - 0.2 x10E3/uL   Immature Granulocytes 0 Not Estab. %   Immature Grans (Abs) 0.0 0.0 - 0.1 x10E3/uL  TSH  Result Value Ref Range   TSH 1.420 0.450 - 4.500 uIU/mL  Vitamin D (25 hydroxy)  Result Value Ref Range   Vit D, 25-Hydroxy 72.2 30.0 - 100.0 ng/mL  Vitamin B12  Result Value Ref Range   Vitamin B-12 712 232 - 1,245 pg/mL      Assessment & Plan:   Problem List Items Addressed This Visit      Cardiovascular and Mediastinum   Migraine - Primary  Significant  improvement on maxalt. If becoming too frequent, may need to add daily agent      Relevant Medications   buPROPion (WELLBUTRIN SR) 100 MG 12 hr tablet   traZODone (DESYREL) 50 MG tablet   rizatriptan (MAXALT) 10 MG tablet     Other   ADHD    Switch to medium dose concerta and monitor for improvement. Will f/u via mychart in 1 month and in clinic in 3 months      Depression    Increase wellbutrin to 100 mg BID and monitor for benefit. Recommended counseling, pt will consider. Restart regular exercise routine      Relevant Medications   buPROPion (WELLBUTRIN SR) 100 MG 12 hr tablet   traZODone (DESYREL) 50 MG tablet    Other Visit Diagnoses    Insomnia, unspecified type       Will trial trazodone for sleep and further mood control. Risks and benefits reviewed   Immunization due       Relevant Orders   Flu Vaccine QUAD 6+ mos PF IM (Fluarix Quad PF) (Completed)       Follow up plan: Return in about 3 months (around 06/28/2018) for Mood, ADHD f/u.

## 2018-03-29 NOTE — Telephone Encounter (Signed)
Requested Prescriptions  Pending Prescriptions Disp Refills  . rizatriptan (MAXALT) 10 MG tablet [Pharmacy Med Name: RIZATRIPTAN 10MG  TABLETS] 9 tablet 0    Sig: TAKE 1 TABLET BY MOUTH AS NEEDED FOR MIGRAINE. MAY REPEAT IN 2 HOURS IF NEEDED     Neurology:  Migraine Therapy - Triptan Passed - 03/28/2018  3:34 PM      Passed - Last BP in normal range    BP Readings from Last 1 Encounters:  03/28/18 130/80         Passed - Valid encounter within last 12 months    Recent Outpatient Visits          Yesterday Immunization due   [place], Lilia Argue, [place]   2 months ago Attention deficit hyperactivity disorder (ADHD), unspecified ADHD type   [place], [place], [place]   5 months ago Attention deficit hyperactivity disorder (ADHD), unspecified ADHD type   [facility], Megan P, [place]   7 months ago Migraine without status migrainosus, not intractable, unspecified migraine type   [facility], Lilia Argue, [place]   8 months ago Attention deficit hyperactivity disorder (ADHD), unspecified ADHD type   [place], [place], [place]

## 2018-03-29 NOTE — Assessment & Plan Note (Signed)
Increase wellbutrin to 100 mg BID and monitor for benefit. Recommended counseling, pt will consider. Restart regular exercise routine

## 2018-03-29 NOTE — Assessment & Plan Note (Signed)
Significant improvement on maxalt. If becoming too frequent, may need to add daily agent

## 2018-03-29 NOTE — Assessment & Plan Note (Signed)
Switch to medium dose concerta and monitor for improvement. Will f/u via mychart in 1 month and in clinic in 3 months

## 2018-04-05 DIAGNOSIS — D485 Neoplasm of uncertain behavior of skin: Secondary | ICD-10-CM | POA: Diagnosis not present

## 2018-04-05 DIAGNOSIS — D225 Melanocytic nevi of trunk: Secondary | ICD-10-CM | POA: Diagnosis not present

## 2018-04-12 DIAGNOSIS — D485 Neoplasm of uncertain behavior of skin: Secondary | ICD-10-CM | POA: Diagnosis not present

## 2018-04-20 ENCOUNTER — Emergency Department
Admission: EM | Admit: 2018-04-20 | Discharge: 2018-04-20 | Disposition: A | Discharge status: Home / Self Care | Payer: BLUE CROSS/BLUE SHIELD | Attending: Student in an Organized Health Care Education/Training Program | Admitting: Student in an Organized Health Care Education/Training Program

## 2018-04-20 ENCOUNTER — Emergency Department: Payer: BLUE CROSS/BLUE SHIELD

## 2018-04-20 ENCOUNTER — Ambulatory Visit: Payer: Self-pay

## 2018-04-20 ENCOUNTER — Encounter: Payer: Self-pay | Admitting: Emergency Medicine

## 2018-04-20 DIAGNOSIS — R079 Chest pain, unspecified: Secondary | ICD-10-CM | POA: Insufficient documentation

## 2018-04-20 DIAGNOSIS — R0789 Other chest pain: Secondary | ICD-10-CM

## 2018-04-20 DIAGNOSIS — Z79899 Other long term (current) drug therapy: Secondary | ICD-10-CM | POA: Insufficient documentation

## 2018-04-20 LAB — URINALYSIS, COMPLETE (UACMP) WITH MICROSCOPIC
Bilirubin Urine: NEGATIVE
Glucose, UA: NEGATIVE mg/dL
Hgb urine dipstick: NEGATIVE
Ketones, ur: NEGATIVE mg/dL
Leukocytes, UA: NEGATIVE
Nitrite: NEGATIVE
PH: 7 (ref 5.0–8.0)
Protein, ur: NEGATIVE mg/dL
Specific Gravity, Urine: 1.004 — ABNORMAL LOW (ref 1.005–1.030)

## 2018-04-20 LAB — BASIC METABOLIC PANEL
Anion gap: 6 (ref 5–15)
BUN: 9 mg/dL (ref 6–20)
CO2: 26 mmol/L (ref 22–32)
Calcium: 8.9 mg/dL (ref 8.9–10.3)
Chloride: 106 mmol/L (ref 98–111)
Creatinine, Ser: 0.75 mg/dL (ref 0.44–1.00)
GFR calc Af Amer: >60 mL/min (ref >60)
GFR calc non Af Amer: >60 mL/min (ref >60)
Glucose, Bld: 96 mg/dL (ref 70–99)
POTASSIUM: 4.4 mmol/L (ref 3.5–5.1)
Sodium: 138 mmol/L (ref 135–145)

## 2018-04-20 LAB — CBC
HCT: 40.3 % (ref 36.0–46.0)
Hemoglobin: 13.8 g/dL (ref 12.0–15.0)
MCH: 30.8 pg (ref 26.0–34.0)
MCHC: 34.2 g/dL (ref 30.0–36.0)
MCV: 90 fL (ref 80.0–100.0)
NRBC: 0 % (ref 0.0–0.2)
Platelets: 329 10*3/uL (ref 150–400)
RBC: 4.48 MIL/uL (ref 3.87–5.11)
RDW: 12.3 % (ref 11.5–15.5)
WBC: 5.6 10*3/uL (ref 4.0–10.5)

## 2018-04-20 LAB — TROPONIN I

## 2018-04-20 LAB — POCT PREGNANCY, URINE: PREG TEST UR: NEGATIVE

## 2018-04-20 LAB — FIBRIN DERIVATIVES D-DIMER (ARMC ONLY): Fibrin derivatives D-dimer (ARMC): 185.98 ng/mL (FEU) (ref 0.00–499.00)

## 2018-04-20 LAB — HCG, QUANTITATIVE, PREGNANCY: hCG, Beta Chain, Quant, S: 1 m[IU]/mL (ref <5)

## 2018-04-20 MED ORDER — NITROFURANTOIN MONOHYD MACRO 100 MG PO CAPS: 100.0000 mg | ORAL_CAPSULE | Freq: Two times a day (BID) | ORAL | 0 refills | Status: AC

## 2018-04-20 MED ORDER — KETOROLAC TROMETHAMINE 30 MG/ML IJ SOLN
15.0000 mg | Freq: Once | INTRAMUSCULAR | Status: AC
  Administered 2018-04-20: 15 mg via INTRAVENOUS
  Filled 2018-04-20: qty 1

## 2018-04-20 MED ORDER — SODIUM CHLORIDE 0.9 % IV BOLUS
1000.0000 mL | Freq: Once | INTRAVENOUS | Status: AC
  Administered 2018-04-20: 1000 mL via INTRAVENOUS

## 2018-04-20 MED ORDER — IPRATROPIUM-ALBUTEROL 0.5-2.5 (3) MG/3ML IN SOLN
3.0000 mL | Freq: Once | RESPIRATORY_TRACT | Status: AC
  Administered 2018-04-20: 3 mL via RESPIRATORY_TRACT
  Filled 2018-04-20: qty 3

## 2018-04-20 NOTE — ED Provider Notes (Signed)
[facility] Emergency Department Provider Note    First MD Initiated Contact with Patient 04/20/18 1403     (approximate)  I have reviewed the triage vital signs and the nursing notes.   HISTORY  Chief Complaint Chest Pain    HPI Regina [name] is a 37 y.o. female listed past medical history presents to the ER with 24 hours of midsternal chest pain as well as "swimmy headedness" that is been intermittent for the past several days.  Says it does hurt when she takes a deep breath.  She does have Mirena IUD.  No recent travel no recent prolonged immobilization.  Denies any fevers or cough.  Does not smoke.  No history of hypertension, high cholesterol or diabetes.  Describes the pain at this time as feeling pressure in her chest and started while she was at work yesterday.  Nothing makes it better or worse.  No recent illnesses.   Past Medical History:  Diagnosis Date  . Cancer (HCC)    cervical cancer  . Chronic idiopathic constipation   . Depression   . Headache    migraines  . Kidney stone   . Skin cancer   . Tubal pregnancy    Family History  Problem Relation Age of Onset  . Diabetes Mother   . Hypertension Mother   . Cancer Father        colon  . Diabetes Father   . Hypertension Father   . Migraines Father   . Diabetes Maternal Grandfather    Past Surgical History:  Procedure Laterality Date  . SKIN CANCER EXCISION    . TUBAL LIGATION    . 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 Depression 08/20/2017  . Migraine 07/09/2017  . ADHD 07/02/2016      Prior to Admission medications   Medication Sig Start Date End Date Taking? Authorizing Provider  buPROPion (WELLBUTRIN SR) 100 MG 12 hr tablet Take 1 tablet (100 mg total) by mouth 2 (two) times daily. 03/28/18   Volney American, PA-C  levonorgestrel (MIRENA) 20 MCG/24HR IUD 1 each by Intrauterine route once.     [provider]  methylphenidate (CONCERTA) 27 MG PO CR tablet Take 1 tablet (27 mg total) by mouth every morning. 03/28/18   Volney American, PA-C  nitrofurantoin, macrocrystal-monohydrate, (MACROBID) 100 MG capsule Take 1 capsule (100 mg total) by mouth 2 (two) times daily for 3 days. 04/20/18 04/23/18  Merlyn Lot, MD  rizatriptan (MAXALT) 10 MG tablet Take 1 tablet (10 mg total) by mouth as needed for migraine. May repeat in 2 hours if needed 03/28/18   Volney American, PA-C  traZODone (DESYREL) 50 MG tablet Take 0.5 tablets (25 mg total) by mouth at bedtime. 03/28/18   Volney American, PA-C    Allergies Penicillins    Social History Social History   Tobacco Use  . Smoking status: Never Smoker  . Smokeless tobacco: Never Used  Substance Use Topics  . Alcohol use: Yes    Comment: on occasion  . Drug use: No    Review of Systems Patient denies headaches, rhinorrhea, blurry vision, numbness, shortness of breath, chest pain, edema, cough, abdominal pain, nausea, vomiting, diarrhea, dysuria, fevers, rashes or hallucinations unless otherwise stated above in HPI. ____________________________________________   PHYSICAL EXAM:  VITAL SIGNS: Vitals:   04/20/18 1242  BP: (!) 144/94  Pulse: 84  Resp: 18  Temp: 97.8 F (36.6 C)  SpO2: 100%    Constitutional: Alert and oriented.  Eyes: Conjunctivae are normal.  Head: Atraumatic. Nose: No congestion/rhinnorhea. Mouth/Throat: Mucous membranes are moist.   Neck: No stridor. Painless ROM.  Cardiovascular: Normal rate, regular rhythm. Grossly normal heart sounds.  Good peripheral circulation. Respiratory: Normal respiratory effort.  No retractions. Lungs CTAB. Gastrointestinal: Soft and nontender. No distention. No abdominal bruits. No CVA tenderness. Genitourinary:  Musculoskeletal: No lower extremity tenderness nor edema.  No joint effusions. Neurologic:  Normal speech and language. No gross  focal neurologic deficits are appreciated. No facial droop Skin:  Skin is warm, dry and intact. No rash noted. Psychiatric: Mood and affect are normal. Speech and behavior are normal.  ____________________________________________   LABS (all labs ordered are listed, but only abnormal results are displayed)  Results for orders placed or performed during the hospital encounter of 04/20/18 (from the past 24 hour(s))  Basic metabolic panel     Status: None   Collection Time: 04/20/18 12:59 PM  Result Value Ref Range   Sodium 138 135 - 145 mmol/L   Potassium 4.4 3.5 - 5.1 mmol/L   Chloride 106 98 - 111 mmol/L   CO2 26 22 - 32 mmol/L   Glucose, Bld 96 70 - 99 mg/dL   BUN 9 6 - 20 mg/dL   Creatinine, Ser 0.75 0.44 - 1.00 mg/dL   Calcium 8.9 8.9 - 10.3 mg/dL   GFR calc non Af Amer >60 >60 mL/min   GFR calc Af Amer >60 >60 mL/min   Anion gap 6 5 - 15  CBC     Status: None   Collection Time: 04/20/18 12:59 PM  Result Value Ref Range   WBC 5.6 4.0 - 10.5 K/uL   RBC 4.48 3.87 - 5.11 MIL/uL   Hemoglobin 13.8 12.0 - 15.0 g/dL   HCT 40.3 36.0 - 46.0 %   MCV 90.0 80.0 - 100.0 fL   MCH 30.8 26.0 - 34.0 pg   MCHC 34.2 30.0 - 36.0 g/dL   RDW 12.3 11.5 - 15.5 %   Platelets 329 150 - 400 K/uL   nRBC 0.0 0.0 - 0.2 %  Troponin I - ONCE - STAT     Status: None   Collection Time: 04/20/18 12:59 PM  Result Value Ref Range   Troponin I <0.03 <0.03 ng/mL  hCG, quantitative, pregnancy     Status: None   Collection Time: 04/20/18 12:59 PM  Result Value Ref Range   hCG, Beta Chain, Quant, S 1 <5 mIU/mL  Fibrin derivatives D-Dimer ([facility] only)     Status: None   Collection Time: 04/20/18  2:36 PM  Result Value Ref Range   Fibrin derivatives D-dimer ([facility]) 185.98 0.00 - 499.00 ng/mL (FEU)  Troponin I - Once-Timed     Status: None   Collection Time: 04/20/18  2:36 PM  Result Value Ref Range   Troponin I <0.03 <0.03 ng/mL  Urinalysis, Complete w Microscopic     Status: Abnormal   Collection Time:  04/20/18  2:44 PM  Result Value Ref Range   Color, Urine STRAW (A) YELLOW   APPearance CLEAR (A) CLEAR   Specific Gravity, Urine 1.004 (L) 1.005 - 1.030   pH 7.0 5.0 - 8.0   Glucose, UA NEGATIVE NEGATIVE mg/dL   Hgb urine dipstick NEGATIVE NEGATIVE   Bilirubin Urine NEGATIVE NEGATIVE   Ketones, ur NEGATIVE NEGATIVE mg/dL   Protein, ur NEGATIVE NEGATIVE mg/dL  Nitrite NEGATIVE NEGATIVE   Leukocytes, UA NEGATIVE NEGATIVE   RBC / HPF 0-5 0 - 5 RBC/hpf   WBC, UA 0-5 0 - 5 WBC/hpf   Bacteria, UA RARE (A) NONE SEEN   Squamous Epithelial / LPF 0-5 0 - 5   Mucus PRESENT   Pregnancy, urine POC     Status: None   Collection Time: 04/20/18  2:44 PM  Result Value Ref Range   Preg Test, Ur NEGATIVE NEGATIVE   ____________________________________________  EKG My review and personal interpretation at Time: 12:41   Indication: chest pain  Rate: 80  Rhythm: sinus Axis: normal Other: normal intervals, no stemi ____________________________________________  RADIOLOGY  I personally reviewed all radiographic images ordered to evaluate for the above acute complaints and reviewed radiology reports and findings.  These findings were personally discussed with the patient.  Please see medical record for radiology report.  ____________________________________________   PROCEDURES  Procedure(s) performed:  Procedures    Critical Care performed: no ____________________________________________   INITIAL IMPRESSION / ASSESSMENT AND PLAN / ED COURSE  Pertinent labs & imaging results that were available during my care of the patient were reviewed by me and considered in my medical decision making (see chart for details).   DDX: ACS, pericarditis, esophagitis, boerhaaves, pe, dissection, pna, bronchitis, costochondritis   Regina [name] is a 37 y.o. who presents to the ED with symptoms as described above.  Patient well-appearing nontoxic.  Afebrile Heema dynamically stable.  Exam is  reassuring.  Patient is low risk by [name] criteria will send d-dimer to further risk stratify.  Very low risk by heart score but will order serial enzyme to further stratify.  Will provide IV hydration and reassess.  Clinical Course as of Apr 20 1600  Wed Apr 20, 2018  1600 Reassessed.  Repeat troponin negative.  D-dimer is negative.  No change in symptoms after DuoNeb.  At this point do believe she stable and appropriate for outpatient referral.   [PR]    Clinical Course User Index [PR] Merlyn Lot, MD     As part of my medical decision making, I reviewed the following data within the [facility] notes reviewed and incorporated, Labs reviewed, notes from prior ED visits. ____________________________________________   FINAL CLINICAL IMPRESSION(S) / ED DIAGNOSES  Final diagnoses:  Atypical chest pain      NEW MEDICATIONS STARTED DURING THIS VISIT:  New Prescriptions   NITROFURANTOIN, MACROCRYSTAL-MONOHYDRATE, (MACROBID) 100 MG CAPSULE    Take 1 capsule (100 mg total) by mouth 2 (two) times daily for 3 days.     Note:  This document was prepared using Dragon voice recognition software and may include unintentional dictation errors.    Merlyn Lot, MD 04/20/18 [phone]

## 2018-04-20 NOTE — Telephone Encounter (Signed)
C/o chest pain @ 3:00 PM in center and left of center of chest; like a tightening and stabbing pain, otherwise a constant steady pain at level 3/10.  Denied any radiation of the pain.  BP 139/95, Pulse 100 @ 3:00 PM 12/10.  Stated she could feel her heart racing.  Denied shortness of breath, but stated "I feel like I need to take a deep breath."  C/o feeling mild light headedness; stated "like something is off."  Reported similar episode over 2 hr. period last week that subsided.  Questioned if this could be related to her new ADHD medication, or if this could be r/t anxiety?  Advised ER eval.  Stated she has a lot going on at work and unable to go ER.    Called [facility]; was advised that Merrie Roof prefers the pt. To go to ER now, but it chooses not to leave work, then recommended she go to [facility] or ER after work.  Pt. Advised of above recommendation.  Verb. Understanding.      Reason for Disposition . Chest pain lasts > 5 minutes (Exceptions: chest pain occurring > 3 days ago and now asymptomatic; same as previously diagnosed heartburn and has accompanying sour taste in mouth)  Answer Assessment - Initial Assessment Questions 1. LOCATION: "Where does it hurt?"       Center and left of center of chest 2. RADIATION: "Does the pain go anywhere else?" (e.g., into neck, jaw, arms, back)     denied 3. ONSET: "When did the chest pain begin?" (Minutes, hours or days)      3:00 PM yesterday  4. PATTERN "Does the pain come and go, or has it been constant since it started?"  "Does it get worse with exertion?"      constant 5. DURATION: "How long does it last" (e.g., seconds, minutes, hours)     Constant, dull  6. SEVERITY: "How bad is the pain?"  (e.g., Scale 1-10; mild, moderate, or severe)    - MILD (1-3): doesn't interfere with normal activities     - MODERATE (4-7): interferes with normal activities or awakens from sleep    - SEVERE (8-10): excruciating pain, unable to do any normal activities     Constant  heavy 3/10; with intermittent tightening and stabbing pain  7. CARDIAC RISK FACTORS: "Do you have any history of heart problems or risk factors for heart disease?" (e.g., prior heart attack, angina; high blood pressure, diabetes, being overweight, high cholesterol, smoking, or strong family history of heart disease)     Denied DM, HTN, elev. Cholesterol, nonsmoker ; mother with HTN and DM  8. PULMONARY RISK FACTORS: "Do you have any history of lung disease?"  (e.g., blood clots in lung, asthma, emphysema, birth control pills)     denied 9. CAUSE: "What do you think is causing the chest pain?"     unknown 10. OTHER SYMPTOMS: "Do you have any other symptoms?" (e.g., dizziness, nausea, vomiting, sweating, fever, difficulty breathing, cough)       Headache, mild light-headed; feels like something is off; denied shortness of breath.  Feels like needs to take a deep breath; denied N/V or sweating   11. PREGNANCY: "Is there any chance you are pregnant?" "When was your last menstrual period?"       Has the [facility] for [facility]  Protocols used: CHEST PAIN-A-AH

## 2018-04-20 NOTE — ED Notes (Signed)
AAOx3.  Skin warm and dry.  NAD 

## 2018-04-20 NOTE — ED Triage Notes (Signed)
Patient presents to ED via POV from work due to left sided chest pain that went into her shoulder blades. Patient reports pain began yesterday. Patient reports "it feels like something is sitting on my chest". Patient reports pain is constant. Patient endorses dizziness with chest pain as well.

## 2018-05-05 ENCOUNTER — Other Ambulatory Visit: Payer: Self-pay | Admitting: Family Medicine

## 2018-05-05 ENCOUNTER — Encounter: Payer: Self-pay | Admitting: Family Medicine

## 2018-05-05 MED ORDER — BUPROPION HCL 75 MG PO TABS: 75.0000 mg | ORAL_TABLET | Freq: Two times a day (BID) | ORAL | 2 refills | Status: AC

## 2018-05-05 MED ORDER — METHYLPHENIDATE HCL ER (OSM) 27 MG PO TBCR: 27.0000 mg | EXTENDED_RELEASE_TABLET | ORAL | 0 refills | Status: DC

## 2018-06-29 ENCOUNTER — Ambulatory Visit (INDEPENDENT_AMBULATORY_CARE_PROVIDER_SITE_OTHER): Payer: BLUE CROSS/BLUE SHIELD | Admitting: Obstetrics and Gynecology

## 2018-06-29 ENCOUNTER — Encounter: Payer: Self-pay | Admitting: Obstetrics and Gynecology

## 2018-06-29 VITALS — BP 128/90 | HR 79 | Ht 67.0 in | Wt 135.1 lb

## 2018-06-29 DIAGNOSIS — N6320 Unspecified lump in the left breast, unspecified quadrant: Secondary | ICD-10-CM | POA: Diagnosis not present

## 2018-06-29 DIAGNOSIS — R3915 Urgency of urination: Secondary | ICD-10-CM

## 2018-06-29 NOTE — Progress Notes (Signed)
  Subjective:     Patient ID: [name], female   DOB: May 25, 1980, 38 y.o.   MRN: [id number]  HPI Feels a lump in left breast, has bilateral implants replaced 4 years ago. Noticed lump with tenderness 5 days ago. Denies these findings in the past.  Also reports re-current UTIs having one monthly, with last occurrence this past weekend, took two days of antibiotics and 3 days of AZO and symptoms resolved. States feels bladder pressure and increased urgency. Denies blood in urine or fever associated with them.    Review of Systems  Genitourinary: Positive for urgency.  All other systems reviewed and are negative.       Objective:   Physical Exam A&Ox4 Well groomed female in no distress Blood pressure 128/90, pulse 79, height 5\' 7"  (1.702 m), weight 135 lb 1.6 oz (61.3 kg). Breasts: right breast normal without mass, skin or nipple changes or axillary nodes, abnormal mass palpable noted in outer left breast at 3 o'clock, movable, slightly tender, pea sized and superficial, bilateral implants, no palpable abnormalities otherwise. UA negative- sent for culture.     Assessment:     Left breast lump Frequent UTIs Urinary urgency    Plan:     Diagnostic mammogram and ultrasound ordered-will follow up accordingly Urine sent for culture and sensitivity Discussed possible IC and dietary changes that are helpful.  RTC 1 month for AE as it is past due.    Daneille Desilva,CNM

## 2018-06-29 NOTE — Addendum Note (Signed)
Addended by: Keturah Barre L on: 06/29/2018 02:28 PM   Modules accepted: Orders

## 2018-06-29 NOTE — Patient Instructions (Signed)
Breast Cyst  A breast cyst is a sac in the breast that is filled with fluid. Breast cysts are usually noncancerous (benign). They are common among women, and they are most often located in the upper, outer portion of the breast. One or more cysts may develop. They form when fluid builds up inside of the breast glands. There are several types of breast cysts:  Macrocyst. This is a cyst that is about 2 inches (5.1 cm) across (in diameter).  Microcyst. This is a very small cyst that you cannot feel, but it can be seen with imaging tests such as an X-ray of the breast (mammogram) or ultrasound.  Galactocele. This is a cyst that contains milk. It may develop if you suddenly stop breastfeeding. Breast cysts do not increase your risk of breast cancer. They usually disappear after menopause, unless you take artificial hormones (are on hormone therapy). What are the causes? The exact cause of breast cysts is not known. Possible causes include:  Blockage of tubes (ducts) in the breast glands, which leads to fluid buildup. Duct blockage may result from: ? Fibrocystic breast changes. This is a common, benign condition that occurs when women go through hormonal changes during the menstrual cycle. This is a common cause of multiple breast cysts. ? Overgrowth of breast tissue or breast glands. ? Scar tissue in the breast from previous surgery.  Changes in certain female hormones (estrogen and progesterone). What increases the risk? You may be more likely to develop breast cysts if you have not gone through menopause. What are the signs or symptoms? Symptoms of a breast cyst may include:  Feeling one or more smooth, round, soft lumps (like grapes) in the breast that are easily moveable. The lump(s) may get bigger and more painful before your period and get smaller after your period.  Breast discomfort or pain. How is this diagnosed? A cyst can be felt during a physical exam by your health care provider.  A mammogram and ultrasound will be done to confirm the diagnosis. Fluid may be removed from the cyst with a needle (fine-needle aspiration) and tested to make sure the cyst is not cancerous. How is this treated? Treatment may not be necessary. Your health care provider may monitor the cyst to see if it goes away on its own. If the cyst is uncomfortable or gets bigger, or if you do not like how the cyst makes your breast look, you may need treatment. Treatment may include:  Hormone treatment.  Fine-needle aspiration, to drain fluid from the cyst. There is a chance of the cyst coming back (recurring) after aspiration.  Surgery to remove the cyst. Follow these instructions at home:  See your health care provider regularly. ? Get a yearly physical exam. ? If you are 36-49 years old, get a clinical breast exam every 1-3 years. After age 47, get this exam every year. ? Get mammograms as often as directed.  Do a breast self-exam every month, or as often as directed. Having many breast cysts, or "lumpy" breasts, may make it harder to feel for new lumps. Understand how your breasts normally look and feel, and write down any changes in your breasts so you can tell your health care provider about the changes. A breast self-exam involves: ? Comparing your breasts in the mirror. ? Looking for visible changes in your skin or nipples. ? Feeling for lumps or changes.  Take over-the-counter and prescription medicines only as told by your health care provider.  Wear  a supportive bra, especially when exercising.  Follow instructions from your health care provider about eating and drinking restrictions. ? Avoid caffeine. ? Cut down on salt (sodium) in what you eat and drink, especially before your menstrual period. Too much sodium can cause fluid buildup (retention), breast swelling, and discomfort.  Keep all follow-up visits as told your health care provider. This is important. Contact a health care  provider if:  You feel, or think you feel, a lump in your breast.  You notice that both breasts look or feel different than usual.  Your breast is still causing pain after your menstrual period is over.  You find new lumps or bumps that were not there before.  You feel lumps in your armpit (axilla). Get help right away if:  You have severe pain, tenderness, redness, or warmth in your breast.  You have fluid or blood leaking from your nipple.  Your breast lump becomes hard and painful.  You notice dimpling or wrinkling of the breast or nipple. This information is not intended to replace advice given to you by your health care provider. Make sure you discuss any questions you have with your health care provider. Document Released: 04/27/2005 Document Revised: 01/17/2016 Document Reviewed: 01/17/2016 Elsevier Interactive Patient Education  2019 [employer] Urinary Tract Infection, Adult  A urinary tract infection (UTI) is an infection of any part of the urinary tract. The urinary tract includes the kidneys, ureters, bladder, and urethra. These organs make, store, and get rid of urine in the body. Your health care provider may use other names to describe the infection. An upper UTI affects the ureters and kidneys (pyelonephritis). A lower UTI affects the bladder (cystitis) and urethra (urethritis). What are the causes? Most urinary tract infections are caused by bacteria in your genital area, around the entrance to your urinary tract (urethra). These bacteria grow and cause inflammation of your urinary tract. What increases the risk? You are more likely to develop this condition if:  You have a urinary catheter that stays in place (indwelling).  You are not able to control when you urinate or have a bowel movement (you have incontinence).  You are female and you: ? Use a spermicide or diaphragm for birth control. ? Have low estrogen levels. ? Are pregnant.  You have certain  genes that increase your risk (genetics).  You are sexually active.  You take antibiotic medicines.  You have a condition that causes your flow of urine to slow down, such as: ? An enlarged prostate, if you are female. ? Blockage in your urethra (stricture). ? A kidney stone. ? A nerve condition that affects your bladder control (neurogenic bladder). ? Not getting enough to drink, or not urinating often.  You have certain medical conditions, such as: ? Diabetes. ? A weak disease-fighting system (immunesystem). ? Sickle cell disease. ? Gout. ? Spinal cord injury. What are the signs or symptoms? Symptoms of this condition include:  Needing to urinate right away (urgently).  Frequent urination or passing small amounts of urine frequently.  Pain or burning with urination.  Blood in the urine.  Urine that smells bad or unusual.  Trouble urinating.  Cloudy urine.  Vaginal discharge, if you are female.  Pain in the abdomen or the lower back. You may also have:  Vomiting or a decreased appetite.  Confusion.  Irritability or tiredness.  A fever.  Diarrhea. The first symptom in older adults may be confusion. In some cases, they may not have  any symptoms until the infection has worsened. How is this diagnosed? This condition is diagnosed based on your medical history and a physical exam. You may also have other tests, including:  Urine tests.  Blood tests.  Tests for sexually transmitted infections (STIs). If you have had more than one UTI, a cystoscopy or imaging studies may be done to determine the cause of the infections. How is this treated? Treatment for this condition includes:  Antibiotic medicine.  Over-the-counter medicines to treat discomfort.  Drinking enough water to stay hydrated. If you have frequent infections or have other conditions such as a kidney stone, you may need to see a health care provider who specializes in the urinary tract  (urologist). In rare cases, urinary tract infections can cause sepsis. Sepsis is a life-threatening condition that occurs when the body responds to an infection. Sepsis is treated in the hospital with IV antibiotics, fluids, and other medicines. Follow these instructions at home:  Medicines  Take over-the-counter and prescription medicines only as told by your health care provider.  If you were prescribed an antibiotic medicine, take it as told by your health care provider. Do not stop using the antibiotic even if you start to feel better. General instructions  Make sure you: ? Empty your bladder often and completely. Do not hold urine for long periods of time. ? Empty your bladder after sex. ? Wipe from front to back after a bowel movement if you are female. Use each tissue one time when you wipe.  Drink enough fluid to keep your urine pale yellow.  Keep all follow-up visits as told by your health care provider. This is important. Contact a health care provider if:  Your symptoms do not get better after 1-2 days.  Your symptoms go away and then return. Get help right away if you have:  Severe pain in your back or your lower abdomen.  A fever.  Nausea or vomiting. Summary  A urinary tract infection (UTI) is an infection of any part of the urinary tract, which includes the kidneys, ureters, bladder, and urethra.  Most urinary tract infections are caused by bacteria in your genital area, around the entrance to your urinary tract (urethra).  Treatment for this condition often includes antibiotic medicines.  If you were prescribed an antibiotic medicine, take it as told by your health care provider. Do not stop using the antibiotic even if you start to feel better.  Keep all follow-up visits as told by your health care provider. This is important. This information is not intended to replace advice given to you by your health care provider. Make sure you discuss any questions you  have with your health care provider. Document Released: 02/04/2005 Document Revised: 11/04/2017 Document Reviewed: 11/04/2017 Elsevier Interactive Patient Education  2019 [employer]. Interstitial Cystitis  Interstitial cystitis is inflammation of the bladder. This may cause pain in the bladder area as well as a frequent and urgent need to urinate. The bladder is a hollow organ in the lower part of the abdomen. It stores urine after the urine is made in the kidneys. The severity of interstitial cystitis can vary from person to person. You may have flare-ups, and then your symptoms may go away for a while. For many people, it becomes a long-term (chronic) problem. What are the causes? The cause of this condition is not known. What increases the risk? The following factors may make you more likely to develop this condition:  You are female.  You  have fibromyalgia.  You have irritable bowel syndrome (IBS).  You have endometriosis. This condition may be aggravated by:  Stress.  Smoking.  Spicy foods. What are the signs or symptoms? Symptoms of interstitial cystitis vary, and they can change over time. Symptoms may include:  Discomfort or pain in the bladder area, which is in the lower abdomen. Pain can range from mild to severe. The pain may change in intensity as the bladder fills with urine or as it empties.  Pain in the pelvic area, between the hip bones.  An urgent need to urinate.  Frequent urination.  Pain during urination.  Pain during sex.  Blood in the urine. For women, symptoms often get worse during menstruation. How is this diagnosed? This condition is diagnosed based on your symptoms, your medical history, and a physical exam. You may have tests to rule out other conditions, such as:  Urine tests.  Cystoscopy. For this test, a tool similar to a very thin telescope is used to look into your bladder.  Biopsy. This involves taking a sample of tissue from the  bladder to be examined under a microscope. How is this treated? There is no cure for this condition, but treatment can help you control your symptoms. Work closely with your health care provider to find the most effective treatments for you. Treatment options may include:  Medicines to relieve pain and reduce how often you feel the need to urinate.  Learning ways to control when you urinate (bladder training).  Lifestyle changes, such as changing your diet or taking steps to control stress.  Using a device that provides electrical stimulation to your nerves, which can relieve pain (neuromodulation therapy). The device is placed on your back, where it blocks the nerves that cause you to feel pain in your bladder area.  A procedure that stretches your bladder by filling it with air or fluid.  Surgery. This is rare. It is only done for extreme cases, if other treatments do not help. Follow these instructions at home: Bladder training   Use bladder training techniques as directed. Techniques may include: ? Urinating at scheduled times. ? Training yourself to delay urination. ? Doing exercises (Kegel exercises) to strengthen the muscles that control urine flow.  Keep a bladder diary. ? Write down the times that you urinate and any symptoms that you have. This can help you find out which foods, liquids, or activities make your symptoms worse. ? Use your bladder diary to schedule bathroom trips. If you are away from home, plan to be near a bathroom at each of your scheduled times.  Make sure that you urinate just before you leave the house and just before you go to bed. Eating and drinking  Make dietary changes as recommended by your health care provider. You may need to avoid: ? Spicy foods. ? Foods that contain a lot of potassium.  Limit your intake of beverages that make you need to urinate. These include: ? Caffeinated beverages like soda, coffee, and tea. ? Alcohol. General  instructions  Take over-the-counter and prescription medicines only as told by your health care provider.  Do not drink alcohol.  You can try a warm or cool compress over your bladder for comfort.  Avoid wearing tight clothing.  Do not use any products that contain nicotine or tobacco, such as cigarettes and e-cigarettes. If you need help quitting, ask your health care provider.  Keep all follow-up visits as told by your health care provider. This is  important. Contact a health care provider if you have:  Symptoms that do not get better with treatment.  Pain or discomfort that gets worse.  More frequent urges to urinate.  A fever. Get help right away if:  You have no control over when you urinate. Summary  Interstitial cystitis is inflammation of the bladder.  This condition may cause pain in the bladder area as well as a frequent and urgent need to urinate.  You may have flare-ups of the condition, and then it may go away for a while. For many people, it becomes a long-term (chronic) problem.  There is no cure for interstitial cystitis, but treatment methods are available to control your symptoms. This information is not intended to replace advice given to you by your health care provider. Make sure you discuss any questions you have with your health care provider. Document Released: 12/27/2003 Document Revised: 03/22/2017 Document Reviewed: 03/22/2017 Elsevier Interactive Patient Education  2019 [employer].

## 2018-07-01 LAB — URINE CULTURE

## 2018-07-05 ENCOUNTER — Ambulatory Visit
Admission: RE | Admit: 2018-07-05 | Discharge: 2018-07-05 | Disposition: A | Discharge status: Home / Self Care | Payer: BLUE CROSS/BLUE SHIELD | Source: Ambulatory Visit | Attending: Obstetrics and Gynecology | Admitting: Obstetrics and Gynecology

## 2018-07-05 DIAGNOSIS — N6320 Unspecified lump in the left breast, unspecified quadrant: Secondary | ICD-10-CM

## 2018-07-05 DIAGNOSIS — N6489 Other specified disorders of breast: Secondary | ICD-10-CM | POA: Diagnosis not present

## 2018-07-05 DIAGNOSIS — N6011 Diffuse cystic mastopathy of right breast: Secondary | ICD-10-CM | POA: Diagnosis not present

## 2018-07-05 DIAGNOSIS — R922 Inconclusive mammogram: Secondary | ICD-10-CM | POA: Diagnosis not present

## 2018-07-06 ENCOUNTER — Ambulatory Visit (INDEPENDENT_AMBULATORY_CARE_PROVIDER_SITE_OTHER): Payer: BLUE CROSS/BLUE SHIELD | Admitting: Gastroenterology

## 2018-07-06 ENCOUNTER — Encounter: Payer: Self-pay | Admitting: Gastroenterology

## 2018-07-06 ENCOUNTER — Telehealth: Payer: Self-pay

## 2018-07-06 VITALS — BP 129/90 | HR 76 | Ht 67.0 in | Wt 137.0 lb

## 2018-07-06 DIAGNOSIS — K625 Hemorrhage of anus and rectum: Secondary | ICD-10-CM | POA: Diagnosis not present

## 2018-07-06 DIAGNOSIS — R197 Diarrhea, unspecified: Secondary | ICD-10-CM

## 2018-07-06 NOTE — Progress Notes (Signed)
Regina Antigua, MD [address]  [place]  [place], [place] [address]  Main: [phone]  Fax: [phone]   Primary Care Physician: Volney American, PA-C  CC: Blood per rectum  HPI: Regina [name] is a 38 y.o. female previously seen in our clinic due to constipation, called this morning as she noted bright red blood per rectum.  Patient states for the last 2 months she has been having 1-2 loose bowel movements a day.  This is also associated with blood streaks in stool.  However, this morning was the heaviest red blood per rectum.  She describes feeling flesh like tissue this morning.  Since then she had a brown bowel movement with blood streaks, but not nearly as much blood as she had this morning.  Does report for the last 4 to 6 weeks she has also felt abdominal discomfort and nausea.  No vomiting.  No family history of colon cancer.  Reportedly her father had a colonoscopy that revealed a "precancerous mass" and had to have a section of his colon removed.  I do not have the report of this and patient was asked to obtain this report and find out details.  But patient states she was unable to do so.   Current Outpatient Medications  Medication Sig Dispense Refill  . buPROPion (WELLBUTRIN) 75 MG tablet Take 1 tablet (75 mg total) by mouth 2 (two) times daily. 60 tablet 2  . levonorgestrel (MIRENA) 20 MCG/24HR IUD 1 each by Intrauterine route once.    . methylphenidate (CONCERTA) 27 MG PO CR tablet Take 1 tablet (27 mg total) by mouth every morning. 30 tablet 0  . rizatriptan (MAXALT) 10 MG tablet Take 1 tablet (10 mg total) by mouth as needed for migraine. May repeat in 2 hours if needed 30 tablet 2  . traZODone (DESYREL) 50 MG tablet Take 0.5 tablets (25 mg total) by mouth at bedtime. 30 tablet 0   No current facility-administered medications for this visit.     Allergies as of 07/06/2018 - Review Complete 06/29/2018  Allergen Reaction Noted  . Penicillins  Nausea And Vomiting 11/01/2014    ROS:  General: Negative for anorexia, weight loss, fever, chills, fatigue, weakness. ENT: Negative for hoarseness, difficulty swallowing , nasal congestion. CV: Negative for chest pain, angina, palpitations, dyspnea on exertion, peripheral edema.  Respiratory: Negative for dyspnea at rest, dyspnea on exertion, cough, sputum, wheezing.  GI: See history of present illness. GU:  Negative for dysuria, hematuria, urinary incontinence, urinary frequency, nocturnal urination.  Endo: Negative for unusual weight change.    Physical Examination:  Vitals:   07/06/18 1309  BP: 129/90  Pulse: 76  Weight: 137 lb (62.1 kg)  Height: 5\' 7"  (1.702 m)     General: Well-nourished, well-developed in no acute distress.  Eyes: No icterus. Conjunctivae pink. Mouth: Oropharyngeal mucosa moist and pink , no lesions erythema or exudate. Neck: Supple, Trachea midline Abdomen: Bowel sounds are normal, nontender, nondistended, no hepatosplenomegaly or masses, no abdominal bruits or hernia , no rebound or guarding.   Rectal exam: 1 small external skin tag, digital rectal exam with no masses in the rectal vault, yellow stool, no blood or melena. Extremities: No lower extremity edema. No clubbing or deformities. Neuro: Alert and oriented x 3.  Grossly intact. Skin: Warm and dry, no jaundice.   Psych: Alert and cooperative, normal mood and affect.   Labs: CMP     Component Value Date/Time   NA 138  04/20/2018 1259   NA 140 03/17/2017 1450   K 4.4 04/20/2018 1259   CL 106 04/20/2018 1259   CO2 26 04/20/2018 1259   GLUCOSE 96 04/20/2018 1259   BUN 9 04/20/2018 1259   BUN 9 03/17/2017 1450   CREATININE 0.75 04/20/2018 1259   CALCIUM 8.9 04/20/2018 1259   PROT 6.8 03/17/2017 1450   ALBUMIN 4.8 03/17/2017 1450   AST 17 03/17/2017 1450   ALT 16 03/17/2017 1450   ALKPHOS 63 03/17/2017 1450   BILITOT 0.3 03/17/2017 1450   GFRNONAA >60 04/20/2018 1259   GFRAA >60  04/20/2018 1259   Lab Results  Component Value Date   WBC 5.6 04/20/2018   HGB 13.8 04/20/2018   HCT 40.3 04/20/2018   MCV 90.0 04/20/2018   PLT 329 04/20/2018    Imaging Studies: US Breast Ltd Uni Left Inc Axilla  Result Date: 07/05/2018 CLINICAL DATA:  Palpable lump left breast. EXAM: DIGITAL DIAGNOSTIC BILATERAL MAMMOGRAM WITH IMPLANTS, CAD AND TOMO ULTRASOUND BILATERAL BREAST The patient has implants. Standard and implant displaced views were performed. COMPARISON:  No prior films. ACR Breast Density Category c: The breast tissue is heterogeneously dense, which may obscure small masses. FINDINGS: Cc and MLO views of bilateral breasts, Eklund cc and MLO views of bilateral breast, spot tangential view of left breast are submitted. In the palpable area left breast, there is a mass subjacent to the skin. Images of the right breast demonstrate a retroareolar right breast mass. Targeted ultrasound is performed, showing 1 x 0.3 x 1 cm normal intramammary lymph node at the palpable area left breast 3 o'clock 5 cm from nipple, correlating to the mammographic mass. Images of the retroareolar right breast 3:30 o'clock demonstrate a group of simple cysts measuring maximum combined dimension of 2.1 cm correlating to the mammographic mass. Mammographic images were processed with CAD. IMPRESSION: Benign findings. RECOMMENDATION: Routine screening mammogram at age 40. I have discussed the findings and recommendations with the patient. Results were also provided in writing at the conclusion of the visit. If applicable, a reminder letter will be sent to the patient regarding the next appointment. BI-RADS CATEGORY  2: Benign. Electronically Signed   By: Abelardo Diesel M.D.   On: 07/05/2018 15:47   US Breast Ltd Uni Right Inc Axilla  Result Date: 07/05/2018 CLINICAL DATA:  Palpable lump left breast. EXAM: DIGITAL DIAGNOSTIC BILATERAL MAMMOGRAM WITH IMPLANTS, CAD AND TOMO ULTRASOUND BILATERAL BREAST The patient has  implants. Standard and implant displaced views were performed. COMPARISON:  No prior films. ACR Breast Density Category c: The breast tissue is heterogeneously dense, which may obscure small masses. FINDINGS: Cc and MLO views of bilateral breasts, Eklund cc and MLO views of bilateral breast, spot tangential view of left breast are submitted. In the palpable area left breast, there is a mass subjacent to the skin. Images of the right breast demonstrate a retroareolar right breast mass. Targeted ultrasound is performed, showing 1 x 0.3 x 1 cm normal intramammary lymph node at the palpable area left breast 3 o'clock 5 cm from nipple, correlating to the mammographic mass. Images of the retroareolar right breast 3:30 o'clock demonstrate a group of simple cysts measuring maximum combined dimension of 2.1 cm correlating to the mammographic mass. Mammographic images were processed with CAD. IMPRESSION: Benign findings. RECOMMENDATION: Routine screening mammogram at age 66. I have discussed the findings and recommendations with the patient. Results were also provided in writing at the conclusion of the visit. If applicable, a reminder  letter will be sent to the patient regarding the next appointment. BI-RADS CATEGORY  2: Benign. Electronically Signed   By: Abelardo Diesel M.D.   On: 07/05/2018 15:47   Mm Diag Breast W/implant Tomo Bilateral  Result Date: 07/05/2018 CLINICAL DATA:  Palpable lump left breast. EXAM: DIGITAL DIAGNOSTIC BILATERAL MAMMOGRAM WITH IMPLANTS, CAD AND TOMO ULTRASOUND BILATERAL BREAST The patient has implants. Standard and implant displaced views were performed. COMPARISON:  No prior films. ACR Breast Density Category c: The breast tissue is heterogeneously dense, which may obscure small masses. FINDINGS: Cc and MLO views of bilateral breasts, Eklund cc and MLO views of bilateral breast, spot tangential view of left breast are submitted. In the palpable area left breast, there is a mass subjacent to  the skin. Images of the right breast demonstrate a retroareolar right breast mass. Targeted ultrasound is performed, showing 1 x 0.3 x 1 cm normal intramammary lymph node at the palpable area left breast 3 o'clock 5 cm from nipple, correlating to the mammographic mass. Images of the retroareolar right breast 3:30 o'clock demonstrate a group of simple cysts measuring maximum combined dimension of 2.1 cm correlating to the mammographic mass. Mammographic images were processed with CAD. IMPRESSION: Benign findings. RECOMMENDATION: Routine screening mammogram at age 42. I have discussed the findings and recommendations with the patient. Results were also provided in writing at the conclusion of the visit. If applicable, a reminder letter will be sent to the patient regarding the next appointment. BI-RADS CATEGORY  2: Benign. Electronically Signed   By: Abelardo Diesel M.D.   On: 07/05/2018 15:47    Assessment and Plan:   CALISTA CRAIN is a 38 y.o. y/o female with history of constipation, now having loose stools for the last 2 months with intermittent bright blood per rectum  Given her 23-month history of loose stools and associated nausea, will rule out infectious diarrhea with GI panel  If this is negative, proceed with colonoscopy due to patient's symptoms of bright red blood per rectum which was at its worst this morning.  Symptoms most consistent with likely internal hemorrhoids   Will obtain hemoglobin today to ensure it is normal and if acutely drop she may need inpatient admission or a colonoscopy sooner than later.  Continue high-fiber diet  I have discussed alternative options, risks & benefits,  which include, but are not limited to, bleeding, infection, perforation,respiratory complication & drug reaction.  The patient agrees with this plan & written consent will be obtained.       Dr Regina [name]

## 2018-07-06 NOTE — Telephone Encounter (Signed)
Pt calls stated that this morning when she had a normal BM, she felt a warm gush, and noted blood (more than normal period). She felt big tissue at her rectum, cleaned with wash cloth and wet wipes of which was covered with flesh like tissue. Currently she is not having any rectal bleeding. She does c/o abdominal pressure, feeling like she needs to have a bowel movement, not severe. She was recently checked for UTI and this was negative, but this pressure is similar to that feeling. Pt states that she also had this approximately a month ago, warm gush of blood, no flesh tissue noted.  Pt to come to clinic today at 1:00 pm for an appt.

## 2018-07-07 LAB — HEMOGLOBIN: Hemoglobin: 12.9 g/dL (ref 11.1–15.9)

## 2018-07-07 NOTE — Progress Notes (Signed)
Your Hemoglobin was normal, meaning you are not anemic. We await your stool studies.

## 2018-07-07 NOTE — Addendum Note (Signed)
Addended by: Earl Lagos on: 07/07/2018 11:00 AM   Modules accepted: Orders, SmartSet

## 2018-07-11 DIAGNOSIS — R197 Diarrhea, unspecified: Secondary | ICD-10-CM | POA: Diagnosis not present

## 2018-07-14 LAB — GI PROFILE, STOOL, PCR
Adenovirus F 40/41: NOT DETECTED
Astrovirus: NOT DETECTED
C difficile toxin A/B: NOT DETECTED
Campylobacter: NOT DETECTED
Cryptosporidium: NOT DETECTED
Cyclospora cayetanensis: NOT DETECTED
Entamoeba histolytica: NOT DETECTED
Enteroaggregative E coli: NOT DETECTED
Enteropathogenic E coli: NOT DETECTED
Enterotoxigenic E coli: NOT DETECTED
Giardia lamblia: NOT DETECTED
Norovirus GI/GII: NOT DETECTED
Plesiomonas shigelloides: NOT DETECTED
Rotavirus A: NOT DETECTED
SAPOVIRUS: NOT DETECTED
Salmonella: NOT DETECTED
Shiga-toxin-producing E coli: NOT DETECTED
Shigella/Enteroinvasive E coli: NOT DETECTED
Vibrio cholerae: NOT DETECTED
Vibrio: NOT DETECTED
Yersinia enterocolitica: NOT DETECTED

## 2018-07-15 NOTE — Progress Notes (Signed)
Stool test was normal. Proceed with colonoscopy as planeed

## 2018-07-22 ENCOUNTER — Encounter
Admission: RE | Disposition: A | Discharge status: Home / Self Care | Payer: Self-pay | Source: Home / Self Care | Attending: Gastroenterology

## 2018-07-22 ENCOUNTER — Encounter: Payer: Self-pay | Admitting: Anesthesiology

## 2018-07-22 ENCOUNTER — Ambulatory Visit
Admission: RE | Admit: 2018-07-22 | Discharge: 2018-07-22 | Disposition: A | Discharge status: Home / Self Care | Payer: BLUE CROSS/BLUE SHIELD | Attending: Gastroenterology | Admitting: Gastroenterology

## 2018-07-22 ENCOUNTER — Ambulatory Visit: Payer: BLUE CROSS/BLUE SHIELD | Admitting: Anesthesiology

## 2018-07-22 ENCOUNTER — Other Ambulatory Visit: Payer: Self-pay

## 2018-07-22 DIAGNOSIS — K625 Hemorrhage of anus and rectum: Secondary | ICD-10-CM

## 2018-07-22 DIAGNOSIS — F329 Major depressive disorder, single episode, unspecified: Secondary | ICD-10-CM | POA: Diagnosis not present

## 2018-07-22 DIAGNOSIS — K621 Rectal polyp: Secondary | ICD-10-CM | POA: Diagnosis not present

## 2018-07-22 DIAGNOSIS — D128 Benign neoplasm of rectum: Secondary | ICD-10-CM

## 2018-07-22 DIAGNOSIS — Z79899 Other long term (current) drug therapy: Secondary | ICD-10-CM | POA: Insufficient documentation

## 2018-07-22 DIAGNOSIS — Z85828 Personal history of other malignant neoplasm of skin: Secondary | ICD-10-CM | POA: Insufficient documentation

## 2018-07-22 DIAGNOSIS — K921 Melena: Secondary | ICD-10-CM | POA: Insufficient documentation

## 2018-07-22 DIAGNOSIS — R195 Other fecal abnormalities: Secondary | ICD-10-CM | POA: Diagnosis not present

## 2018-07-22 HISTORY — PX: COLONOSCOPY WITH PROPOFOL: SHX5780

## 2018-07-22 LAB — POCT PREGNANCY, URINE: Preg Test, Ur: NEGATIVE

## 2018-07-22 SURGERY — COLONOSCOPY WITH PROPOFOL
Anesthesia: General

## 2018-07-22 MED ORDER — PROPOFOL 500 MG/50ML IV EMUL
INTRAVENOUS | Status: AC
  Filled 2018-07-22: qty 50

## 2018-07-22 MED ORDER — FENTANYL CITRATE (PF) 100 MCG/2ML IJ SOLN
INTRAMUSCULAR | Status: AC
  Filled 2018-07-22: qty 2

## 2018-07-22 MED ORDER — SODIUM CHLORIDE 0.9 % IV SOLN
INTRAVENOUS | Status: DC
  Administered 2018-07-22 (×2): via INTRAVENOUS

## 2018-07-22 MED ORDER — FENTANYL CITRATE (PF) 100 MCG/2ML IJ SOLN
INTRAMUSCULAR | Status: DC | PRN
  Administered 2018-07-22 (×2): 50 ug via INTRAVENOUS

## 2018-07-22 MED ORDER — SODIUM CHLORIDE (PF) 0.9 % IJ SOLN
INTRAMUSCULAR | Status: DC | PRN
  Administered 2018-07-22: 6 mL via INTRAVENOUS

## 2018-07-22 MED ORDER — MIDAZOLAM HCL 2 MG/2ML IJ SOLN
INTRAMUSCULAR | Status: DC | PRN
  Administered 2018-07-22: 2 mg via INTRAVENOUS

## 2018-07-22 MED ORDER — PROPOFOL 10 MG/ML IV BOLUS
INTRAVENOUS | Status: AC
  Filled 2018-07-22: qty 20

## 2018-07-22 MED ORDER — PROPOFOL 500 MG/50ML IV EMUL
INTRAVENOUS | Status: DC | PRN
  Administered 2018-07-22: 120 ug/kg/min via INTRAVENOUS

## 2018-07-22 MED ORDER — MIDAZOLAM HCL 2 MG/2ML IJ SOLN
INTRAMUSCULAR | Status: AC
  Filled 2018-07-22: qty 2

## 2018-07-22 NOTE — Op Note (Signed)
[facility] Patient Name: Regina [name] Procedure Date: 07/22/2018 9:47 AM MRN: [id number] Account #: [id number] Date of Birth: 28-Jan-1981 Admit Type: Outpatient Age: 38 Room: [facility] ENDO ROOM 2 Gender: Female Note Status: Finalized Procedure:            Colonoscopy Indications:          Hematochezia Providers:            Maalik Pinn B. Bonna Gains MD, MD Referring MD:         Volney American (Referring MD) Medicines:            Monitored Anesthesia Care Complications:        No immediate complications. Procedure:            Pre-Anesthesia Assessment:                       - Prior to the procedure, a History and Physical was                        performed, and patient medications, allergies and                        sensitivities were reviewed. The patient's tolerance of                        previous anesthesia was reviewed.                       - The risks and benefits of the procedure and the                        sedation options and risks were discussed with the                        patient. All questions were answered and informed                        consent was obtained.                       - Patient identification and proposed procedure were                        verified prior to the procedure by the physician, the                        nurse, the anesthetist and the technician. The                        procedure was verified in the pre-procedure area in the                        procedure room in the endoscopy suite.                       - ASA Grade Assessment: II - A patient with mild                        systemic disease.                       -  After reviewing the risks and benefits, the patient                        was deemed in satisfactory condition to undergo the                        procedure.                       After obtaining informed consent, the colonoscope was                        passed under  direct vision. Throughout the procedure,                        the patient's blood pressure, pulse, and oxygen                        saturations were monitored continuously. The                        Colonoscope was introduced through the anus and                        advanced to the the terminal ileum. The colonoscopy was                        performed with ease. The patient tolerated the                        procedure well. The quality of the bowel preparation                        was good. Findings:      The perianal and digital rectal examinations were normal.      A 20 mm polyp was found in the rectum. The polyp was multi-lobulated and       sessile. A part of the polyp had fallen off due to its soft texture.       This was retrieved in the suction trap for pathology. Area was       successfully injected with saline for lesion assessment, but the lesion       could not be lifted adequately. This was present at 12 cm from anal       verge and did no appear to involve the anal verge. It was       non-obstructing.      The sigmoid colon, descending colon, transverse colon, ascending colon,       cecum and ileum appeared normal. Biopsies for histology were taken with       a cold forceps from the cecum, ascending colon, transverse colon and       sigmoid colon for evaluation of microscopic colitis.      Retroflexion in the rectum was not performed due to large rectal polyp       causing resistance on retroflexion. Impression:           - One 20 mm polyp in the rectum. Injected.                       - The sigmoid colon, descending colon, transverse  colon, ascending colon, cecum and terminal ileum are                        normal. Biopsied. Recommendation:       - Refer to a surgeon, Dr. Leim Fabry                        surgery, for surgical resection of large polyp.                       - Discharge patient to home.                        - Resume previous diet.                       - Continue present medications.                       - Return to primary care physician as previously                        scheduled.                       - The findings and recommendations were discussed with                        the patient.                       - The findings and recommendations were discussed with                        the patient's family.                       - Await pathology results. Procedure Code(s):    --- Professional ---                       [phone], Colonoscopy, flexible; with directed submucosal                        injection(s), any substance                       40814, Colonoscopy, flexible; with biopsy, single or                        multiple Diagnosis Code(s):    --- Professional ---                       K62.1, Rectal polyp                       K92.1, Melena (includes Hematochezia) CPT copyright 2018 American Medical Association. All rights reserved. The codes documented in this report are preliminary and upon coder review may  be revised to meet current compliance requirements.  [name], MD Margretta Sidle B. Bonna Gains MD, MD 07/22/2018 10:50:17 AM This report has been signed electronically. Number of Addenda: 0 Note Initiated On: 07/22/2018 9:47 AM Scope Withdrawal Time: 0 hours 19 minutes 26 seconds  Total Procedure Duration: 0 hours 40 minutes 2 seconds  Estimated Blood Loss: Estimated blood loss: none.      [facility]

## 2018-07-22 NOTE — Anesthesia Preprocedure Evaluation (Signed)
Anesthesia Evaluation  Patient identified by MRN, date of birth, ID band Patient awake    Reviewed: Allergy & Precautions, H&P , NPO status , Patient's Chart, lab work & pertinent test results, reviewed documented beta blocker date and time   Airway Mallampati: II   Neck ROM: full    Dental  (+) Poor Dentition   Pulmonary neg pulmonary ROS,    Pulmonary exam normal        Cardiovascular Exercise Tolerance: Good negative cardio ROS Normal cardiovascular exam Rhythm:regular Rate:Normal     Neuro/Psych  Headaches, PSYCHIATRIC DISORDERS Depression    GI/Hepatic negative GI ROS, Neg liver ROS,   Endo/Other  negative endocrine ROS  Renal/GU Renal disease  negative genitourinary   Musculoskeletal   Abdominal   Peds  Hematology negative hematology ROS (+)   Anesthesia Other Findings Past Medical History: No date: Cancer ([place])     Comment:  cervical cancer No date: Chronic idiopathic constipation No date: Depression No date: Headache     Comment:  migraines No date: Kidney stone No date: Skin cancer No date: Tubal pregnancy Past Surgical History: No date: SKIN CANCER EXCISION No date: TUBAL LIGATION No date: tumor removed from mouth     Comment:  non cancerous   Reproductive/Obstetrics negative OB ROS                             Anesthesia Physical Anesthesia Plan  ASA: II  Anesthesia Plan: General   Post-op Pain Management:    Induction:   PONV Risk Score and Plan:   Airway Management Planned:   Additional Equipment:   Intra-op Plan:   Post-operative Plan:   Informed Consent: I have reviewed the patients History and Physical, chart, labs and discussed the procedure including the risks, benefits and alternatives for the proposed anesthesia with the patient or authorized representative who has indicated his/her understanding and acceptance.     Dental Advisory  Given  Plan Discussed with: CRNA  Anesthesia Plan Comments:         Anesthesia Quick Evaluation

## 2018-07-22 NOTE — Anesthesia Procedure Notes (Signed)
Performed by: Cook-Martin, Jermaine Neuharth Pre-anesthesia Checklist: Patient identified, Emergency Drugs available, Suction available, Patient being monitored and Timeout performed Patient Re-evaluated:Patient Re-evaluated prior to induction Oxygen Delivery Method: Nasal cannula Preoxygenation: Pre-oxygenation with 100% oxygen Induction Type: IV induction Placement Confirmation: CO2 detector and positive ETCO2       

## 2018-07-22 NOTE — Transfer of Care (Signed)
Immediate Anesthesia Transfer of Care Note  Patient: Regina [name]  Procedure(s) Performed: COLONOSCOPY WITH PROPOFOL (N/A )  Patient Location: PACU  Anesthesia Type:General  Level of Consciousness: awake and sedated  Airway & Oxygen Therapy: Patient Spontanous Breathing and Patient connected to nasal cannula oxygen  Post-op Assessment: Report given to RN and Post -op Vital signs reviewed and stable  Post vital signs: Reviewed  Last Vitals:  Vitals Value Taken Time  BP    Temp    Pulse 75 07/22/2018 10:44 AM  Resp 13 07/22/2018 10:44 AM  SpO2 100 % 07/22/2018 10:44 AM  Vitals shown include unvalidated device data.  Last Pain:  Vitals:   07/22/18 1044  TempSrc: (P) Tympanic  PainSc:          Complications: No apparent anesthesia complications

## 2018-07-22 NOTE — Anesthesia Post-op Follow-up Note (Signed)
Anesthesia QCDR form completed.        

## 2018-07-22 NOTE — H&P (Signed)
[name], MD [address], [place], [place], [place], [address] [address], [place], [place], [place], [address] Phone: [phone]  Fax: [phone]  Primary Care Physician:  Volney American, PA-C   Pre-Procedure History & Physical: HPI:  Regina [name] is a 38 y.o. female is here for a colonoscopy.   Past Medical History:  Diagnosis Date  . Cancer (HCC)    cervical cancer  . Chronic idiopathic constipation   . Depression   . Headache    migraines  . Kidney stone   . Skin cancer   . Tubal pregnancy     Past Surgical History:  Procedure Laterality Date  . SKIN CANCER EXCISION    . TUBAL LIGATION    . tumor removed from mouth     non cancerous    Prior to Admission medications   Medication Sig Start Date End Date Taking? Authorizing Provider  buPROPion (WELLBUTRIN) 75 MG tablet Take 1 tablet (75 mg total) by mouth 2 (two) times daily. 05/05/18  Yes Volney American, PA-C  levonorgestrel [facility]) 20 MCG/24HR IUD 1 each by Intrauterine route once.   Yes [provider]  methylphenidate (CONCERTA) 27 MG PO CR tablet Take 1 tablet (27 mg total) by mouth every morning. 05/05/18  Yes Volney American, PA-C  rizatriptan (MAXALT) 10 MG tablet Take 1 tablet (10 mg total) by mouth as needed for migraine. May repeat in 2 hours if needed 03/28/18  Yes Volney American, PA-C  traZODone (DESYREL) 50 MG tablet Take 0.5 tablets (25 mg total) by mouth at bedtime. 03/28/18  Yes Volney American, PA-C    Allergies as of 07/07/2018 - Review Complete 07/06/2018  Allergen Reaction Noted  . Penicillins Nausea And Vomiting 11/01/2014    Family History  Problem Relation Age of Onset  . Diabetes Mother   . Hypertension Mother   . Cancer Father        colon  . Diabetes Father   . Hypertension Father   . Migraines Father   . Diabetes Maternal Grandfather     Social History   Socioeconomic History  . Marital status: Single    Spouse name: Not on file  . Number of children: Not on file  . Years of education: Not on file  . Highest education level: Not on file  Occupational History  . Not on file  Social Needs  . Financial resource strain: Not on file  . Food insecurity:    Worry: Not on file    Inability: Not on file  . Transportation needs:    Medical: Not on file    Non-medical: Not on file  Tobacco Use  . Smoking status: Never Smoker  . Smokeless tobacco: Never Used  Substance and Sexual Activity  . Alcohol use: Yes    Comment: on occasion  . Drug use: No  . Sexual activity: Yes    Birth control/protection: Surgical    Comment: mirena  Lifestyle  . Physical activity:    Days per week: Not on file    Minutes per session: Not on file  . Stress: Not on file  Relationships  . Social connections:    Talks on phone: Not on file    Gets together: Not on file    Attends religious service: Not on file    Active member of club or organization: Not on file    Attends meetings of clubs or organizations: Not on file    Relationship status:  Not on file  . Intimate partner violence:    Fear of current or ex partner: Not on file    Emotionally abused: Not on file    Physically abused: Not on file    Forced sexual activity: Not on file  Other Topics Concern  . Not on file  Social History Narrative  . Not on file    Review of Systems: See HPI, otherwise negative ROS  Physical Exam: There were no vitals taken for this visit. General:   Alert,  pleasant and cooperative in NAD Head:  Normocephalic and atraumatic. Neck:  Supple; no masses or thyromegaly. Lungs:  Clear throughout to auscultation, normal respiratory effort.    Heart:  +S1, +S2, Regular rate and rhythm, No edema. Abdomen:  Soft, nontender and nondistended. Normal bowel sounds, without guarding, and without rebound.   Neurologic:  Alert and  oriented x4;  grossly normal neurologically.  Impression/Plan: Regina [name] is here for  a colonoscopy to be performed for bright red blood per rectum, loose stools  Risks, benefits, limitations, and alternatives regarding  colonoscopy have been reviewed with the patient.  Questions have been answered.  All parties agreeable.   Virgel Manifold, MD  07/22/2018, 9:00 AM

## 2018-07-23 NOTE — Progress Notes (Signed)
Pt very sore and tender but I explained how much we had to put pressure on her abdomen to prevent scope looping

## 2018-07-25 LAB — SURGICAL PATHOLOGY

## 2018-07-26 ENCOUNTER — Telehealth: Payer: Self-pay | Admitting: Gastroenterology

## 2018-07-26 NOTE — Telephone Encounter (Signed)
Sarah from [facility] [place] [facility] is waiting for Dr. Bonna Gains to send the notes from pt visit  Pt is referred to Dr. Dema Severin  But no notes are attached to referral  please fax to [phone]

## 2018-07-26 NOTE — Anesthesia Postprocedure Evaluation (Signed)
Anesthesia Post Note  Patient: Regina [name]  Procedure(s) Performed: COLONOSCOPY WITH PROPOFOL (N/A )  Patient location during evaluation: PACU Anesthesia Type: General Level of consciousness: awake and alert Pain management: pain level controlled Vital Signs Assessment: post-procedure vital signs reviewed and stable Respiratory status: spontaneous breathing, nonlabored ventilation, respiratory function stable and patient connected to nasal cannula oxygen Cardiovascular status: blood pressure returned to baseline and stable Postop Assessment: no apparent nausea or vomiting Anesthetic complications: no     Last Vitals:  Vitals:   07/22/18 1054 07/22/18 1114  BP:  127/84  Pulse:    Resp: 20   Temp:    SpO2:      Last Pain:  Vitals:   07/22/18 1114  TempSrc:   PainSc: 0-No pain                 Molli Barrows

## 2018-07-27 ENCOUNTER — Encounter: Payer: Self-pay | Admitting: Obstetrics and Gynecology

## 2018-07-27 ENCOUNTER — Other Ambulatory Visit: Payer: Self-pay

## 2018-07-27 ENCOUNTER — Other Ambulatory Visit (HOSPITAL_COMMUNITY)
Admission: RE | Admit: 2018-07-27 | Discharge: 2018-07-27 | Disposition: A | Discharge status: Home / Self Care | Payer: BLUE CROSS/BLUE SHIELD | Source: Ambulatory Visit | Attending: Obstetrics and Gynecology | Admitting: Obstetrics and Gynecology

## 2018-07-27 ENCOUNTER — Ambulatory Visit (INDEPENDENT_AMBULATORY_CARE_PROVIDER_SITE_OTHER): Payer: BLUE CROSS/BLUE SHIELD | Admitting: Obstetrics and Gynecology

## 2018-07-27 VITALS — BP 142/84 | HR 88 | Ht 67.0 in | Wt 137.1 lb

## 2018-07-27 DIAGNOSIS — Z01419 Encounter for gynecological examination (general) (routine) without abnormal findings: Secondary | ICD-10-CM | POA: Diagnosis not present

## 2018-07-27 DIAGNOSIS — E041 Nontoxic single thyroid nodule: Secondary | ICD-10-CM | POA: Diagnosis not present

## 2018-07-27 DIAGNOSIS — Z01411 Encounter for gynecological examination (general) (routine) with abnormal findings: Secondary | ICD-10-CM | POA: Diagnosis not present

## 2018-07-27 DIAGNOSIS — Z30431 Encounter for routine checking of intrauterine contraceptive device: Secondary | ICD-10-CM

## 2018-07-27 DIAGNOSIS — Z800 Family history of malignant neoplasm of digestive organs: Secondary | ICD-10-CM | POA: Diagnosis not present

## 2018-07-27 MED ORDER — LISDEXAMFETAMINE DIMESYLATE 30 MG PO CAPS: 30.0000 mg | ORAL_CAPSULE | Freq: Every day | ORAL | 0 refills | Status: DC

## 2018-07-27 NOTE — Patient Instructions (Addendum)
 Preventive Care 18-39 Years, Female Preventive care refers to lifestyle choices and visits with your health care provider that can promote health and wellness. What does preventive care include?   A yearly physical exam. This is also called an annual well check.  Dental exams once or twice a year.  Routine eye exams. Ask your health care provider how often you should have your eyes checked.  Personal lifestyle choices, including: ? Daily care of your teeth and gums. ? Regular physical activity. ? Eating a healthy diet. ? Avoiding tobacco and drug use. ? Limiting alcohol use. ? Practicing safe sex. ? Taking vitamin and mineral supplements as recommended by your health care provider. What happens during an annual well check? The services and screenings done by your health care provider during your annual well check will depend on your age, overall health, lifestyle risk factors, and family history of disease. Counseling Your health care provider may ask you questions about your:  Alcohol use.  Tobacco use.  Drug use.  Emotional well-being.  Home and relationship well-being.  Sexual activity.  Eating habits.  Work and work environment.  Method of birth control.  Menstrual cycle.  Pregnancy history. Screening You may have the following tests or measurements:  Height, weight, and BMI.  Diabetes screening. This is done by checking your blood sugar (glucose) after you have not eaten for a while (fasting).  Blood pressure.  Lipid and cholesterol levels. These may be checked every 5 years starting at age 20.  Skin check.  Hepatitis C blood test.  Hepatitis B blood test.  Sexually transmitted disease (STD) testing.  BRCA-related cancer screening. This may be done if you have a family history of breast, ovarian, tubal, or peritoneal cancers.  Pelvic exam and Pap test. This may be done every 3 years starting at age 21. Starting at age 30, this may be done  every 5 years if you have a Pap test in combination with an HPV test. Discuss your test results, treatment options, and if necessary, the need for more tests with your health care provider. Vaccines Your health care provider may recommend certain vaccines, such as:  Influenza vaccine. This is recommended every year.  Tetanus, diphtheria, and acellular pertussis (Tdap, Td) vaccine. You may need a Td booster every 10 years.  Varicella vaccine. You may need this if you have not been vaccinated.  HPV vaccine. If you are 26 or younger, you may need three doses over 6 months.  Measles, mumps, and rubella (MMR) vaccine. You may need at least one dose of MMR. You may also need a second dose.  Pneumococcal 13-valent conjugate (PCV13) vaccine. You may need this if you have certain conditions and were not previously vaccinated.  Pneumococcal polysaccharide (PPSV23) vaccine. You may need one or two doses if you smoke cigarettes or if you have certain conditions.  Meningococcal vaccine. One dose is recommended if you are age 19-21 years and a first-year college student living in a residence hall, or if you have one of several medical conditions. You may also need additional booster doses.  Hepatitis A vaccine. You may need this if you have certain conditions or if you travel or work in places where you may be exposed to hepatitis A.  Hepatitis B vaccine. You may need this if you have certain conditions or if you travel or work in places where you may be exposed to hepatitis B.  Haemophilus influenzae type b (Hib) vaccine. You may need this if   you have certain risk factors. Talk to your health care provider about which screenings and vaccines you need and how often you need them. This information is not intended to replace advice given to you by your health care provider. Make sure you discuss any questions you have with your health care provider. Document Released: 06/23/2001 Document Revised:  12/08/2016 Document Reviewed: 02/26/2015 Elsevier Interactive Patient Education  2019 [employer]  Thyroid Nodule  A thyroid nodule is an isolatedgrowth of thyroid cells that forms a lump in your thyroid gland. The thyroid gland is a butterfly-shaped gland. It is found in the lower front of your neck. This gland sends chemical messengers (hormones) through your blood to all parts of your body. These hormones are important in regulating your body temperature and helping your body to use energy. Thyroid nodules are common. Most are not cancerous (are benign). You may have one nodule or several nodules. Different types of thyroid nodules include:  Nodules that grow and fill with fluid (thyroid cysts).  Nodules that produce too much thyroid hormone (hot nodules or hyperthyroid).  Nodules that produce no thyroid hormone (cold nodules or hypothyroid).  Nodules that form from cancer cells (thyroid cancers). What are the causes? Usually, the cause of this condition is not known. What increases the risk? Factors that make this condition more likely to develop include:  Increasing age. Thyroid nodules become more common in people who are older than 38 years of age.  Gender. ? Benign thyroid nodules are more common in women. ? Cancerous (malignant) thyroid nodules are more common in men.  A family history that includes: ? Thyroid nodules. ? Pheochromocytoma. ? Thyroid carcinoma. ? Hyperparathyroidism.  Certain kinds of thyroid diseases, such as Hashimoto thyroiditis.  Lack of iodine.  A history of head and neck radiation, such as from X-rays. What are the signs or symptoms? It is common for this condition to cause no symptoms. If you have symptoms, they may include:  A lump in your lower neck.  Feeling a lump or tickle in your throat.  Pain in your neck, jaw, or ear.  Having trouble swallowing. Hot nodules may cause symptoms that include:  Weight loss.  Warm, flushed skin.   Feeling hot.  Feeling nervous.  A racing heartbeat. Cold nodules may cause symptoms that include:  Weight gain.  Dry skin.  Brittle hair. This may also occur with hair loss.  Feeling cold.  Fatigue. Thyroid cancer nodules may cause symptoms that include:  Hard nodules that feel stuck to the thyroid gland.  Hoarseness.  Lumps in the glands near your thyroid (lymph nodes). How is this diagnosed? A thyroid nodule may be felt by your health care provider during a physical exam. This condition may also be diagnosed based on your symptoms. You may also have tests, including:  An ultrasound. This may be done to confirm the diagnosis.  A biopsy. This involves taking a sample from the nodule and looking at it under a microscope to see if the nodule is benign.  Blood tests to make sure that your thyroid is working properly.  Imaging tests such as MRI or CT scan may be done if: ? Your nodule is large. ? Your nodule is blocking your airway. ? Cancer is suspected. How is this treated? Treatment depends on the cause and size of your nodule or nodules. If the nodule is benign, treatment may not be necessary. Your health care provider may monitor the nodule to see if it goes away  without treatment. If the nodule continues to grow, is cancerous, or does not go away:  It may need to be drained with a needle.  It may need to be removed with surgery. If you have surgery, part or all of your thyroid gland may need to be removed as well. Follow these instructions at home:  Pay attention to any changes in your nodule.  Take over-the-counter and prescription medicines only as told by your health care provider.  Keep all follow-up visits as told by your health care provider. This is important. Contact a health care provider if:  Your voice changes.  You have trouble swallowing.  You have pain in your neck, ear, or jaw that is getting worse.  Your nodule gets bigger.  Your nodule  starts to make it harder for you to breathe. Get help right away if:  You have a sudden fever.  You feel very weak.  Your muscles look like they are shrinking (muscle wasting).  You have mood swings.  You feel very restless.  You feel confused.  You are seeing or hearing things that other people do not see or hear (having hallucinations).  You feel suddenly nauseous or throw up.  You suddenly have diarrhea.  You have chest pain.  There is a loss of consciousness. This information is not intended to replace advice given to you by your health care provider. Make sure you discuss any questions you have with your health care provider. Document Released: 03/20/2004 Document Revised: 12/29/2015 Document Reviewed: 08/08/2014 Elsevier Interactive Patient Education  2019 [employer].

## 2018-07-27 NOTE — Progress Notes (Signed)
Subjective:   Regina [name] is a 38 y.o. G34P3 Caucasian female here for a routine well-woman exam.  No LMP recorded. (Menstrual status: IUD).    Current complaints: none PCP: Orene Desanctis       does desire labs, including genetic testing for cancer  Social History: Sexual: heterosexual Marital Status: engaged Living situation: with family Occupation: unknown occupation Tobacco/alcohol: no tobacco use Illicit drugs: no history of illicit drug use  The following portions of the patient's history were reviewed and updated as appropriate: allergies, current medications, past family history, past medical history, past social history, past surgical history and problem list.  Past Medical History Past Medical History:  Diagnosis Date  . Cancer (HCC)    cervical cancer  . Chronic idiopathic constipation   . Depression   . Headache    migraines  . Kidney stone   . Skin cancer   . Tubal pregnancy     Past Surgical History Past Surgical History:  Procedure Laterality Date  . COLONOSCOPY WITH PROPOFOL N/A 07/22/2018   Procedure: COLONOSCOPY WITH PROPOFOL;  Surgeon: Virgel Manifold, MD;  Location: [facility] ENDOSCOPY;  Service: Endoscopy;  Laterality: N/A;  . SKIN CANCER EXCISION    . TUBAL LIGATION    . tumor removed from mouth     non cancerous    Gynecologic History G3P3  No LMP recorded. (Menstrual status: IUD). Contraception: IUD Last Pap: 2016. Results were: normal Last mammogram: 06/2018. Results were: normal   Obstetric History OB History  Gravida Para Term Preterm AB Living  3 3          SAB TAB Ectopic Multiple Live Births               # Outcome Date GA Lbr Len/2nd Weight Sex Delivery Anes PTL Lv  3 Para 2007     Vag-Spont     2 Para 2003     Vag-Spont     1 Para 2002     Vag-Spont       Current Medications Current Outpatient Medications on File Prior to Visit  Medication Sig Dispense Refill  . buPROPion (WELLBUTRIN) 75 MG tablet Take 1 tablet (75 mg total) by  mouth 2 (two) times daily. 60 tablet 2  . levonorgestrel (MIRENA) 20 MCG/24HR IUD 1 each by Intrauterine route once.    . rizatriptan (MAXALT) 10 MG tablet Take 1 tablet (10 mg total) by mouth as needed for migraine. May repeat in 2 hours if needed 30 tablet 2  . traZODone (DESYREL) 50 MG tablet Take 0.5 tablets (25 mg total) by mouth at bedtime. 30 tablet 0  . methylphenidate (CONCERTA) 27 MG PO CR tablet Take 1 tablet (27 mg total) by mouth every morning. (Patient not taking: Reported on 07/27/2018) 30 tablet 0   No current facility-administered medications on file prior to visit.     Review of Systems Patient denies any headaches, blurred vision, shortness of breath, chest pain, abdominal pain, problems with bowel movements, urination, or intercourse.  Objective:  BP (!) 142/84   Pulse 88   Ht 5\' 7"  (1.702 m)   Wt 137 lb 1.6 oz (62.2 kg)   BMI 21.47 kg/m  Physical Exam  General:  Well developed, well nourished, no acute distress. She is alert and oriented x3. Skin:  Warm and dry Neck:  Midline trachea, no thyromegaly  But 1 cm soft nodule noted on left lower lobe. Cardiovascular: Regular rate and rhythm, no murmur heard Lungs:  Effort normal,  all lung fields clear to auscultation bilaterally Breasts:  No dominant palpable mass, retraction, or nipple discharge, fibrocystic tissue noted throughout, and bilateral implants without defect. Abdomen:  Soft, non tender, no hepatosplenomegaly or masses Pelvic:  External genitalia is normal in appearance.  The vagina is normal in appearance. The cervix is bulbous, no CMT.  Thin prep pap is done with HR HPV cotesting. Uterus is felt to be normal size, shape, and contour.  No adnexal masses or tenderness noted. IUD string noted. Extremities:  No swelling or varicosities noted Psych:  She has a normal mood and affect  Assessment:   Healthy well-woman exam IUD check H/o abnormal pap Thyroid nodule,left side Family h/o colon cancer in  father Colon polyp  Plan:  Labs obtained-will follow up accordingly Thyroid ultrasound ordered-will follow up accordingly vyvance refilled for 3 months.  F/U 1 year for AE, or sooner if needed   Melody Rockney Ghee, CNM

## 2018-07-28 LAB — COMPREHENSIVE METABOLIC PANEL
A/G RATIO: 2.2 (ref 1.2–2.2)
ALT: 16 IU/L (ref 0–32)
AST: 13 IU/L (ref 0–40)
Albumin: 4.6 g/dL (ref 3.8–4.8)
Alkaline Phosphatase: 74 IU/L (ref 39–117)
BUN/Creatinine Ratio: 13 (ref 9–23)
BUN: 11 mg/dL (ref 6–20)
Bilirubin Total: 0.3 mg/dL (ref 0.0–1.2)
CALCIUM: 10 mg/dL (ref 8.7–10.2)
CO2: 24 mmol/L (ref 20–29)
Chloride: 102 mmol/L (ref 96–106)
Creatinine, Ser: 0.84 mg/dL (ref 0.57–1.00)
GFR calc Af Amer: 102 mL/min/{1.73_m2} (ref >59)
GFR calc non Af Amer: 88 mL/min/{1.73_m2} (ref >59)
Globulin, Total: 2.1 g/dL (ref 1.5–4.5)
Glucose: 100 mg/dL — ABNORMAL HIGH (ref 65–99)
Potassium: 4.5 mmol/L (ref 3.5–5.2)
Sodium: 141 mmol/L (ref 134–144)
Total Protein: 6.7 g/dL (ref 6.0–8.5)

## 2018-07-28 LAB — THYROID PANEL WITH TSH
Free Thyroxine Index: 2.1 (ref 1.2–4.9)
T3 UPTAKE RATIO: 26 % (ref 24–39)
T4, Total: 7.9 ug/dL (ref 4.5–12.0)
TSH: 1.93 u[IU]/mL (ref 0.450–4.500)

## 2018-07-28 LAB — CBC
Hematocrit: 42.3 % (ref 34.0–46.6)
Hemoglobin: 14.5 g/dL (ref 11.1–15.9)
MCH: 30.9 pg (ref 26.6–33.0)
MCHC: 34.3 g/dL (ref 31.5–35.7)
MCV: 90 fL (ref 79–97)
Platelets: 306 10*3/uL (ref 150–450)
RBC: 4.7 x10E6/uL (ref 3.77–5.28)
RDW: 12 % (ref 11.7–15.4)
WBC: 9 10*3/uL (ref 3.4–10.8)

## 2018-07-28 LAB — HEMOGLOBIN A1C
Est. average glucose Bld gHb Est-mCnc: 88 mg/dL
Hgb A1c MFr Bld: 4.7 % — ABNORMAL LOW (ref 4.8–5.6)

## 2018-07-28 LAB — LIPID PANEL
Chol/HDL Ratio: 2.4 ratio (ref 0.0–4.4)
Cholesterol, Total: 151 mg/dL (ref 100–199)
HDL: 62 mg/dL (ref >39)
LDL Calculated: 77 mg/dL (ref 0–99)
Triglycerides: 58 mg/dL (ref 0–149)
VLDL Cholesterol Cal: 12 mg/dL (ref 5–40)

## 2018-07-28 NOTE — Telephone Encounter (Signed)
Done

## 2018-07-29 ENCOUNTER — Telehealth: Payer: Self-pay

## 2018-07-29 NOTE — Telephone Encounter (Signed)
Patient has been informed of pathology results.  She said she has an appt scheduled for April 3rd to see the surgeon.  Thanks [employer]

## 2018-07-29 NOTE — Telephone Encounter (Signed)
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  Please let the patient know that the high-grade dysplasia means that it is a high risk lesion, but no cancerous cells are seen on the biopsies.  But the polyp will need to be removed by surgery to ensure there is no cancer cells present at all.

## 2018-08-03 LAB — CYTOLOGY - PAP
DIAGNOSIS: NEGATIVE
HPV 16/18/45 genotyping: NEGATIVE
HPV: DETECTED — AB

## 2018-08-22 ENCOUNTER — Telehealth: Payer: Self-pay | Admitting: Gastroenterology

## 2018-08-22 NOTE — Telephone Encounter (Signed)
Ok to refer. 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

## 2018-08-22 NOTE — Telephone Encounter (Signed)
Pt is calling to see if she can get a referral to [facility] Colon and Rectal surgery Dr.Christopher [facility] fax#  [phone]

## 2018-08-23 ENCOUNTER — Other Ambulatory Visit: Payer: BLUE CROSS/BLUE SHIELD

## 2018-08-23 ENCOUNTER — Telehealth: Payer: Self-pay

## 2018-08-23 ENCOUNTER — Other Ambulatory Visit: Payer: Self-pay

## 2018-08-23 DIAGNOSIS — E041 Nontoxic single thyroid nodule: Secondary | ICD-10-CM

## 2018-08-23 NOTE — Telephone Encounter (Signed)
Pt aware thyroid u/s scheduled for 6/15 at 1:45. [facility] - [facility]. Pt aware to arrive at 1:30.   [facility] aware.

## 2018-08-24 ENCOUNTER — Ambulatory Visit: Payer: Self-pay | Admitting: Surgery

## 2018-08-24 ENCOUNTER — Telehealth: Payer: Self-pay

## 2018-08-24 DIAGNOSIS — K581 Irritable bowel syndrome with constipation: Secondary | ICD-10-CM

## 2018-08-24 DIAGNOSIS — D128 Benign neoplasm of rectum: Secondary | ICD-10-CM | POA: Diagnosis not present

## 2018-08-24 DIAGNOSIS — K589 Irritable bowel syndrome without diarrhea: Secondary | ICD-10-CM | POA: Insufficient documentation

## 2018-08-24 NOTE — H&P (Signed)
[name] Documented: 08/24/2018 8:57 AM Location: [facility] Patient #: [id number] DOB: 10/31/1980 Single / Language: Cleophus Molt / Race: White Female  History of Present Illness Adin Hector MD; 08/24/2018 9:57 AM) The patient is a 38 year old female who presents with a colorectal polyp. Note for "Colorectal polyp": ` ` ` Patient sent for surgical consultation at the request of Dr Bonna Gains  Chief Complaint: Proximal rectal polyp ` ` The patient is a pleasant healthy woman with history of irregular bowels for most of her life. He notes she tended to have diarrhea with every meal. After she had her children she became more constipated. Sometimes not having a bowel movement for weeks. Followed by [facility] gastroenterology with adjustment in bowel regimens. Moves her bowels more regularly with the help of that. She did notice a persistent three-month history of intermittent rectal bleeding. Sometimes larger volume. Initially the thought was it could be hemorrhoidal, but colonoscopy recommended. A rectal mass was found by Dr Rose Phi at 12 cm proximal to the anal verge. Biopsy showed adenomatous polyp with high-grade dysplasia. Surgical consultation recommended for excision. Patient is otherwise rather healthy. Sometimes doesn't move her bowel for over a week without the help of laxatives. She had abnormal urgent tubal pregnancy surgery with salpingectomy in 2013. No other abdominal surgeries. She does not smoke. She is not diabetic.  No personal nor family history of inflammatory bowel disease, allergy such as Celiac Sprue, dietary/dairy problems, colitis, ulcers nor gastritis. No recent sick contacts/gastroenteritis. No travel outside the country. No changes in diet. No dysphagia to solids or liquids. No significant heartburn or reflux. No hematochezia, hematemesis, coffee ground emesis. No evidence of prior gastric/peptic ulceration. Her father did  require surgery for a bulky unresectable polyp when he was 38 years old. She believes there was cancer within it.    (Review of systems as stated in this history (HPI) or in the review of systems. Otherwise all other 12 point ROS are negative) ` ` `   Past Surgical History Nance Pew, CMA; 08/24/2018 9:10 AM) Oral Surgery  Diagnostic Studies History Nance Pew, CMA; 08/24/2018 9:10 AM) Colonoscopy within last year Mammogram within last year Pap Smear 1-5 years ago  Allergies Nance Pew, CMA; 08/24/2018 9:11 AM) Penicillins Allergies Reconciled  Medication History (Sabrina Canty, CMA; 08/24/2018 9:11 AM) Vyvanse (30MG  Capsule, Oral) Active. buPROPion HCl ER (SR) (100MG  Tablet ER 12HR, Oral) Active. Methylphenidate HCl ER (27MG  Tablet ER, Oral) Active. Medications Reconciled  Social History Nance Pew, CMA; 08/24/2018 9:10 AM) Alcohol use Occasional alcohol use. Caffeine use Tea. No drug use Tobacco use Never smoker.  Family History Nance Pew, [place]; 08/24/2018 9:10 AM) Colon Polyps Father. Hypertension Mother. Migraine Headache Father, Mother.  Pregnancy / Birth History Nance Pew, [place]; 08/24/2018 9:10 AM) Age at menarche 14 years. Contraceptive History Intrauterine device. Gravida 6 Irregular periods Length (months) of breastfeeding 7-12 Maternal age 70-20 Para 3  Other Problems Nance Pew, CMA; 08/24/2018 9:10 AM) Cervical Cancer Kidney Stone Migraine Headache    Vitals (Sabrina Canty CMA; 08/24/2018 9:12 AM) 08/24/2018 9:11 AM Weight: 134.4 lb Height: 67in Body Surface Area: 1.71 m Body Mass Index: 21.05 kg/m  Temp.: 97.2F(Temporal)  Pulse: 99 (Regular)  BP: 140/80 (Sitting, Left Arm, Standard)      Physical Exam Adin Hector MD; 08/24/2018 9:27 AM)  General Mental Status-Alert. General Appearance-Not in acute distress, Not Sickly. Orientation-Oriented X3. Hydration-Well  hydrated. Voice-Normal.  Integumentary Global Assessment Upon inspection and palpation of skin surfaces  of the - Axillae: non-tender, no inflammation or ulceration, no drainage. and Distribution of scalp and body hair is normal. General Characteristics Temperature - normal warmth is noted.  Head and Neck Head-normocephalic, atraumatic with no lesions or palpable masses. Face Global Assessment - atraumatic, no absence of expression. Neck Global Assessment - no abnormal movements, no bruit auscultated on the right, no bruit auscultated on the left, no decreased range of motion, non-tender. Trachea-midline. Thyroid Gland Characteristics - non-tender.  Eye Eyeball - Left-Extraocular movements intact, No Nystagmus. Eyeball - Right-Extraocular movements intact, No Nystagmus. Cornea - Left-No Hazy. Cornea - Right-No Hazy. Sclera/Conjunctiva - Left-No scleral icterus, No Discharge. Sclera/Conjunctiva - Right-No scleral icterus, No Discharge. Pupil - Left-Direct reaction to light normal. Pupil - Right-Direct reaction to light normal.  ENMT Ears Pinna - Left - no drainage observed, no generalized tenderness observed. Right - no drainage observed, no generalized tenderness observed. Nose and Sinuses External Inspection of the Nose - no destructive lesion observed. Inspection of the nares - Left - quiet respiration. Right - quiet respiration. Mouth and Throat Lips - Upper Lip - no fissures observed, no pallor noted. Lower Lip - no fissures observed, no pallor noted. Nasopharynx - no discharge present. Oral Cavity/Oropharynx - Tongue - no dryness observed. Oral Mucosa - no cyanosis observed. Hypopharynx - no evidence of airway distress observed.  Chest and Lung Exam Inspection Movements - Normal and Symmetrical. Accessory muscles - No use of accessory muscles in breathing. Palpation Palpation of the chest reveals - Non-tender. Auscultation Breath sounds - Normal  and Clear.  Cardiovascular Auscultation Rhythm - Regular. Murmurs & Other Heart Sounds - Auscultation of the heart reveals - No Murmurs and No Systolic Clicks.  Abdomen Inspection Inspection of the abdomen reveals - No Visible peristalsis and No Abnormal pulsations. Umbilicus - No Bleeding, No Urine drainage. Palpation/Percussion Palpation and Percussion of the abdomen reveal - Soft, Non Tender, No Rebound tenderness, No Rigidity (guarding) and No Cutaneous hyperesthesia. Note: Abdomen soft. Not severely distended. No distasis recti. No umbilical or other anterior abdominal wall hernias  Female Genitourinary Sexual Maturity Tanner 5 - Adult hair pattern. Note: No vaginal bleeding nor discharge  Rectal Note: Perianal skin clean with good hygiene. No pruritis ani. No pilonidal disease. No fissure. No abscess/fistula. Normal sphincter tone. No external hemorrhoids. No condyloma warts.  Tolerates digital rectal exam. No rectal masses felt to 10cm. Hemorrhoidal piles normal. Exam done with assistance of female Medical Assistant in the room.  Peripheral Vascular Upper Extremity Inspection - Left - No Cyanotic nailbeds, Not Ischemic. Right - No Cyanotic nailbeds, Not Ischemic.  Neurologic Neurologic evaluation reveals -normal attention span and ability to concentrate, able to name objects and repeat phrases. Appropriate fund of knowledge , normal sensation and normal coordination. Mental Status Affect - not angry, not paranoid. Cranial Nerves-Normal Bilaterally. Gait-Normal.  Neuropsychiatric Mental status exam performed with findings of-able to articulate well with normal speech/language, rate, volume and coherence, thought content normal with ability to perform basic computations and apply abstract reasoning and no evidence of hallucinations, delusions, obsessions or homicidal/suicidal ideation.  Musculoskeletal Global Assessment Spine, Ribs and Pelvis - no  instability, subluxation or laxity. Right Upper Extremity - no instability, subluxation or laxity.  Lymphatic Head & Neck  General Head & Neck Lymphatics: Bilateral - Description - No Localized lymphadenopathy. Axillary  General Axillary Region: Bilateral - Description - No Localized lymphadenopathy. Femoral & Inguinal  Generalized Femoral & Inguinal Lymphatics: Left - Description - No Localized lymphadenopathy. Right -  Description - No Localized lymphadenopathy.    Assessment & Plan Adin Hector MD; 08/24/2018 9:54 AM)  ADENOMATOUS RECTAL POLYP (D12.8) Impression: Adenomatous polyp with high-grade dysplasia and proximal rectum. Looks like involves about 20% of the circumference. Estimated 3 cm size.  I think she would benefit from excision. Because there is no definite proof of cancer, reasonable to start out with TEM partial proctectomy since statistically odds are that it has not progressed to that. If she has definite cancer within the specimen, she will require follow-up low anterior resection. She is a good candidate for a minimally invasive, robotic approach. Another option is to proceed with robotic low anterior resection first, but the specimen seems rather small, so hopefully the risk cancer is on the low end. She wishes to proceed with TEM first which is reasonable. Does not burn any bridges.  Did note that she is at risk for recurrent polyps and needs follow-up with her gastroenterologist for survival screening. Usually start with 1 year out from resection. Her siblings and children are at higher risk as well. She has discussed with them.  Current Plans You are being scheduled for surgery- Our schedulers will call you.  You should hear from our office's scheduling department within 5 working days about the location, date, and time of surgery. We try to make accommodations for patient's preferences in scheduling surgery, but sometimes the OR schedule or the surgeon's  schedule prevents [place] from making those accommodations.  If you have not heard from our office [phone]) in 5 working days, call the office and ask for your surgeon's nurse.  If you have other questions about your diagnosis, plan, or surgery, call the office and ask for your surgeon's nurse.  Pt Education - [facility] TEM Education (Delfin Squillace): discussed with patient and provided information. The anatomy & physiology of the digestive tract was discussed. The pathophysiology of the rectal pathology was discussed. Natural history risks without surgery was discussed. I feel the risks of no intervention will lead to serious problems that outweigh the operative risks; therefore, I recommended surgery.  Laparoscopic & open abdominal techniques were discussed. I recommended we start with a partial proctectomy by transanal endoscopic microsurgery (TEM) for excisional biopsy to remove the pathology and hopefully cure and/or control the pathology. This technique can offer less operative risk and faster post-operative recovery. Possible need for immediate or later abdominal surgery for further treatment was discussed.  Risks such as bleeding, abscess, reoperation, ostomy, heart attack, death, and other risks were discussed. I noted a good likelihood this will help address the problem. Goals of post-operative recovery were discussed as well. We will work to minimize complications. An educational handout was given as well. Questions were answered. The patient expresses understanding & wishes to proceed with surgery. Her fiancagrees.   PREOP COLON - ENCOUNTER FOR PREOPERATIVE EXAMINATION FOR GENERAL SURGICAL PROCEDURE (O03.559)  Current Plans Written instructions provided Pt Education - [facility] Colon Bowel Prep 2018 ERAS/Miralax/Antibiotics Started Neomycin Sulfate 500 MG Oral Tablet, 2 (two) Tablet SEE NOTE, #6, 08/24/2018, No Refill. Local Order: TAKE TWO TABLETS AT 2 PM, 3 PM, AND 10 PM THE DAY PRIOR  TO SURGERY Started Flagyl 500 MG Oral Tablet, 2 (two) Tablet SEE NOTE, #6, 08/24/2018, No Refill. Local Order: Take at 2pm, 3pm, and 10pm the day prior to your colon operation Pt Education - [facility] Colectomy post-op instructions: discussed with patient and provided information.  IRRITABLE BOWEL SYNDROME WITH CONSTIPATION (K58.1) Impression: Sounds like she an IBS component with  her initial diarrhea and now chronic constipation. She has tried different bowel regimens and works with gastroenterology. I will defer to her gastroenterologist on this.  Current Plans Pt Education - [facility] Good Bowel Health (Khanh Tanori) Pt Education - [facility] IBS patient info: discussed with patient and provided information.  Adin Hector, MD, FACS, MASCRS Gastrointestinal and Minimally Invasive Surgery    [address], [place], [place] [address] [phone] Main / Paging [phone] Fax

## 2018-08-24 NOTE — Telephone Encounter (Signed)
Contacted patient to notify her that, her referral request has been sent to Dr. Loura Back office.  Referral faxed to [phone].  Will call his office on Friday for a referral status update.  Thanks [employer]

## 2018-08-26 ENCOUNTER — Telehealth: Payer: Self-pay

## 2018-08-26 NOTE — Telephone Encounter (Signed)
Contacted patient to inform her that, her referral has been received and is in the system.  She has been asked to call Dr. Hester Mates to schedule her appt.  Dr. Marene Lenz Office # [phone] Fax# [phone]  Thanks,  Sharyn Lull

## 2018-08-29 ENCOUNTER — Other Ambulatory Visit (HOSPITAL_COMMUNITY): Payer: Self-pay | Admitting: Internal Medicine

## 2018-08-29 ENCOUNTER — Other Ambulatory Visit: Payer: Self-pay | Admitting: Internal Medicine

## 2018-08-29 DIAGNOSIS — C8590 Non-Hodgkin lymphoma, unspecified, unspecified site: Secondary | ICD-10-CM

## 2018-08-29 DIAGNOSIS — Z0000 Encounter for general adult medical examination without abnormal findings: Secondary | ICD-10-CM | POA: Diagnosis not present

## 2018-08-30 ENCOUNTER — Ambulatory Visit
Admission: RE | Admit: 2018-08-30 | Discharge: 2018-08-30 | Disposition: A | Discharge status: Home / Self Care | Payer: BLUE CROSS/BLUE SHIELD | Source: Ambulatory Visit | Attending: Internal Medicine | Admitting: Internal Medicine

## 2018-08-30 ENCOUNTER — Other Ambulatory Visit: Payer: Self-pay

## 2018-08-30 DIAGNOSIS — C8590 Non-Hodgkin lymphoma, unspecified, unspecified site: Secondary | ICD-10-CM | POA: Diagnosis not present

## 2018-08-30 DIAGNOSIS — E041 Nontoxic single thyroid nodule: Secondary | ICD-10-CM | POA: Diagnosis not present

## 2018-08-30 DIAGNOSIS — R590 Localized enlarged lymph nodes: Secondary | ICD-10-CM | POA: Diagnosis not present

## 2018-08-30 DIAGNOSIS — R221 Localized swelling, mass and lump, neck: Secondary | ICD-10-CM | POA: Diagnosis not present

## 2018-08-30 DIAGNOSIS — K769 Liver disease, unspecified: Secondary | ICD-10-CM | POA: Diagnosis not present

## 2018-08-30 HISTORY — DX: Malignant neoplasm of cervix uteri, unspecified: C53.9

## 2018-08-30 MED ORDER — IOHEXOL 300 MG/ML  SOLN
100.0000 mL | Freq: Once | INTRAMUSCULAR | Status: AC | PRN
  Administered 2018-08-30: 100 mL via INTRAVENOUS

## 2018-08-31 ENCOUNTER — Other Ambulatory Visit: Payer: Self-pay | Admitting: Internal Medicine

## 2018-08-31 DIAGNOSIS — K769 Liver disease, unspecified: Secondary | ICD-10-CM

## 2018-09-01 ENCOUNTER — Other Ambulatory Visit: Payer: Self-pay | Admitting: Obstetrics and Gynecology

## 2018-09-01 MED ORDER — LISDEXAMFETAMINE DIMESYLATE 30 MG PO CAPS: 30.0000 mg | ORAL_CAPSULE | Freq: Every day | ORAL | 0 refills | Status: DC

## 2018-09-05 ENCOUNTER — Other Ambulatory Visit: Payer: Self-pay | Admitting: Internal Medicine

## 2018-09-05 DIAGNOSIS — E041 Nontoxic single thyroid nodule: Secondary | ICD-10-CM | POA: Diagnosis not present

## 2018-09-05 DIAGNOSIS — D352 Benign neoplasm of pituitary gland: Secondary | ICD-10-CM

## 2018-09-06 ENCOUNTER — Other Ambulatory Visit: Payer: Self-pay

## 2018-09-06 ENCOUNTER — Ambulatory Visit
Admission: RE | Admit: 2018-09-06 | Discharge: 2018-09-06 | Disposition: A | Discharge status: Home / Self Care | Payer: BLUE CROSS/BLUE SHIELD | Source: Ambulatory Visit | Attending: Internal Medicine | Admitting: Internal Medicine

## 2018-09-06 DIAGNOSIS — D1803 Hemangioma of intra-abdominal structures: Secondary | ICD-10-CM | POA: Diagnosis not present

## 2018-09-06 DIAGNOSIS — K769 Liver disease, unspecified: Secondary | ICD-10-CM | POA: Insufficient documentation

## 2018-09-06 DIAGNOSIS — D352 Benign neoplasm of pituitary gland: Secondary | ICD-10-CM | POA: Diagnosis not present

## 2018-09-06 DIAGNOSIS — E041 Nontoxic single thyroid nodule: Secondary | ICD-10-CM | POA: Diagnosis not present

## 2018-09-06 MED ORDER — GADOXETATE DISODIUM 0.25 MMOL/ML IV SOLN
10.0000 mL | Freq: Once | INTRAVENOUS | Status: AC | PRN
  Administered 2018-09-06: 6 mL via INTRAVENOUS

## 2018-09-08 ENCOUNTER — Other Ambulatory Visit: Payer: Self-pay | Admitting: *Deleted

## 2018-09-08 ENCOUNTER — Ambulatory Visit
Admission: RE | Admit: 2018-09-08 | Discharge: 2018-09-08 | Disposition: A | Discharge status: Home / Self Care | Payer: BLUE CROSS/BLUE SHIELD | Source: Ambulatory Visit | Attending: Internal Medicine | Admitting: Internal Medicine

## 2018-09-08 ENCOUNTER — Other Ambulatory Visit: Payer: Self-pay

## 2018-09-08 ENCOUNTER — Telehealth: Payer: Self-pay | Admitting: Obstetrics and Gynecology

## 2018-09-08 DIAGNOSIS — E041 Nontoxic single thyroid nodule: Secondary | ICD-10-CM | POA: Insufficient documentation

## 2018-09-08 DIAGNOSIS — D352 Benign neoplasm of pituitary gland: Secondary | ICD-10-CM | POA: Insufficient documentation

## 2018-09-08 DIAGNOSIS — E236 Other disorders of pituitary gland: Secondary | ICD-10-CM | POA: Diagnosis not present

## 2018-09-08 MED ORDER — GADOBUTROL 1 MMOL/ML IV SOLN
6.0000 mL | Freq: Once | INTRAVENOUS | Status: AC | PRN
  Administered 2018-09-08: 18:00:00 6 mL via INTRAVENOUS

## 2018-09-08 MED ORDER — LISDEXAMFETAMINE DIMESYLATE 30 MG PO CAPS: 30.0000 mg | ORAL_CAPSULE | Freq: Every day | ORAL | 0 refills | Status: DC

## 2018-09-08 NOTE — Telephone Encounter (Signed)
The patient called and stated that she would like to speak with Amy and is requesting a call back sometime today if possible. Please advise.

## 2018-09-08 NOTE — Telephone Encounter (Signed)
Spoke with pt

## 2018-09-09 ENCOUNTER — Other Ambulatory Visit: Payer: Self-pay | Admitting: *Deleted

## 2018-09-09 MED ORDER — LISDEXAMFETAMINE DIMESYLATE 30 MG PO CAPS: 30.0000 mg | ORAL_CAPSULE | Freq: Every day | ORAL | 0 refills | Status: DC

## 2018-09-19 DIAGNOSIS — D224 Melanocytic nevi of scalp and neck: Secondary | ICD-10-CM | POA: Diagnosis not present

## 2018-09-19 DIAGNOSIS — L820 Inflamed seborrheic keratosis: Secondary | ICD-10-CM | POA: Diagnosis not present

## 2018-09-19 DIAGNOSIS — L812 Freckles: Secondary | ICD-10-CM | POA: Diagnosis not present

## 2018-09-19 DIAGNOSIS — L821 Other seborrheic keratosis: Secondary | ICD-10-CM | POA: Diagnosis not present

## 2018-09-19 DIAGNOSIS — D485 Neoplasm of uncertain behavior of skin: Secondary | ICD-10-CM | POA: Diagnosis not present

## 2018-09-19 DIAGNOSIS — D225 Melanocytic nevi of trunk: Secondary | ICD-10-CM | POA: Diagnosis not present

## 2018-09-19 DIAGNOSIS — Z1283 Encounter for screening for malignant neoplasm of skin: Secondary | ICD-10-CM | POA: Diagnosis not present

## 2018-09-19 DIAGNOSIS — D229 Melanocytic nevi, unspecified: Secondary | ICD-10-CM | POA: Diagnosis not present

## 2018-09-27 DIAGNOSIS — D128 Benign neoplasm of rectum: Secondary | ICD-10-CM | POA: Diagnosis not present

## 2018-10-04 DIAGNOSIS — D125 Benign neoplasm of sigmoid colon: Secondary | ICD-10-CM | POA: Diagnosis not present

## 2018-10-04 DIAGNOSIS — Z01812 Encounter for preprocedural laboratory examination: Secondary | ICD-10-CM | POA: Diagnosis not present

## 2018-10-04 DIAGNOSIS — Z1159 Encounter for screening for other viral diseases: Secondary | ICD-10-CM | POA: Diagnosis not present

## 2018-10-06 DIAGNOSIS — G43909 Migraine, unspecified, not intractable, without status migrainosus: Secondary | ICD-10-CM | POA: Diagnosis not present

## 2018-10-06 DIAGNOSIS — E041 Nontoxic single thyroid nodule: Secondary | ICD-10-CM | POA: Diagnosis not present

## 2018-10-06 DIAGNOSIS — Z8541 Personal history of malignant neoplasm of cervix uteri: Secondary | ICD-10-CM | POA: Diagnosis not present

## 2018-10-06 DIAGNOSIS — Z800 Family history of malignant neoplasm of digestive organs: Secondary | ICD-10-CM | POA: Diagnosis not present

## 2018-10-06 DIAGNOSIS — K589 Irritable bowel syndrome without diarrhea: Secondary | ICD-10-CM | POA: Diagnosis not present

## 2018-10-06 DIAGNOSIS — Z79899 Other long term (current) drug therapy: Secondary | ICD-10-CM | POA: Diagnosis not present

## 2018-10-06 DIAGNOSIS — Z87442 Personal history of urinary calculi: Secondary | ICD-10-CM | POA: Diagnosis not present

## 2018-10-06 DIAGNOSIS — D128 Benign neoplasm of rectum: Secondary | ICD-10-CM | POA: Diagnosis not present

## 2018-10-06 DIAGNOSIS — K621 Rectal polyp: Secondary | ICD-10-CM | POA: Diagnosis not present

## 2018-10-06 DIAGNOSIS — E236 Other disorders of pituitary gland: Secondary | ICD-10-CM | POA: Diagnosis not present

## 2018-10-06 DIAGNOSIS — C20 Malignant neoplasm of rectum: Secondary | ICD-10-CM | POA: Diagnosis not present

## 2018-10-18 DIAGNOSIS — D485 Neoplasm of uncertain behavior of skin: Secondary | ICD-10-CM | POA: Diagnosis not present

## 2018-10-18 DIAGNOSIS — D225 Melanocytic nevi of trunk: Secondary | ICD-10-CM | POA: Diagnosis not present

## 2018-10-24 ENCOUNTER — Ambulatory Visit: Payer: BLUE CROSS/BLUE SHIELD

## 2018-10-25 ENCOUNTER — Ambulatory Visit: Payer: Self-pay | Admitting: Surgery

## 2018-10-25 DIAGNOSIS — D2239 Melanocytic nevi of other parts of face: Secondary | ICD-10-CM | POA: Diagnosis not present

## 2018-10-25 DIAGNOSIS — D225 Melanocytic nevi of trunk: Secondary | ICD-10-CM | POA: Diagnosis not present

## 2018-11-01 ENCOUNTER — Other Ambulatory Visit: Payer: Self-pay | Admitting: Family Medicine

## 2018-11-01 NOTE — Telephone Encounter (Signed)
Refill for Wellbutrin.  Previously written in 04-2018 by Merrie Roof / Will deny as the patient has not had a recent visit with this provider.  PCP is listed as Dr. Emily Filbert

## 2018-11-08 DIAGNOSIS — C20 Malignant neoplasm of rectum: Secondary | ICD-10-CM | POA: Diagnosis not present

## 2018-11-25 DIAGNOSIS — Z01812 Encounter for preprocedural laboratory examination: Secondary | ICD-10-CM | POA: Diagnosis not present

## 2018-11-25 DIAGNOSIS — Z1159 Encounter for screening for other viral diseases: Secondary | ICD-10-CM | POA: Diagnosis not present

## 2018-11-28 DIAGNOSIS — C20 Malignant neoplasm of rectum: Secondary | ICD-10-CM | POA: Diagnosis not present

## 2018-11-28 DIAGNOSIS — Z8541 Personal history of malignant neoplasm of cervix uteri: Secondary | ICD-10-CM | POA: Diagnosis not present

## 2018-11-28 DIAGNOSIS — K621 Rectal polyp: Secondary | ICD-10-CM | POA: Diagnosis not present

## 2018-11-28 DIAGNOSIS — G43909 Migraine, unspecified, not intractable, without status migrainosus: Secondary | ICD-10-CM | POA: Diagnosis not present

## 2018-11-28 DIAGNOSIS — Z79899 Other long term (current) drug therapy: Secondary | ICD-10-CM | POA: Diagnosis not present

## 2018-11-28 DIAGNOSIS — K6289 Other specified diseases of anus and rectum: Secondary | ICD-10-CM | POA: Diagnosis not present

## 2018-12-15 DIAGNOSIS — D2239 Melanocytic nevi of other parts of face: Secondary | ICD-10-CM | POA: Diagnosis not present

## 2018-12-15 DIAGNOSIS — D225 Melanocytic nevi of trunk: Secondary | ICD-10-CM | POA: Diagnosis not present

## 2018-12-15 DIAGNOSIS — L820 Inflamed seborrheic keratosis: Secondary | ICD-10-CM | POA: Diagnosis not present

## 2018-12-27 DIAGNOSIS — C20 Malignant neoplasm of rectum: Secondary | ICD-10-CM | POA: Diagnosis not present

## 2019-01-02 ENCOUNTER — Telehealth: Payer: Self-pay

## 2019-01-02 NOTE — Telephone Encounter (Signed)
Patient states she does not feel like she needs to follow up in 6 months because she is seeing a oncologist. Patient states she will call back if her oncologist feels she needs to follow up.

## 2019-01-02 NOTE — Telephone Encounter (Signed)
-----   Message from Virgel Manifold, MD sent at 12/27/2018  3:17 PM EDT ----- Please make sure pt has clinic follow up recall set with me for 6 months.

## 2019-01-24 DIAGNOSIS — M25561 Pain in right knee: Secondary | ICD-10-CM | POA: Diagnosis not present

## 2019-01-24 DIAGNOSIS — M79674 Pain in right toe(s): Secondary | ICD-10-CM | POA: Diagnosis not present

## 2019-01-24 DIAGNOSIS — M7050 Other bursitis of knee, unspecified knee: Secondary | ICD-10-CM | POA: Diagnosis not present

## 2019-01-26 DIAGNOSIS — S90211A Contusion of right great toe with damage to nail, initial encounter: Secondary | ICD-10-CM | POA: Diagnosis not present

## 2019-03-29 DIAGNOSIS — E041 Nontoxic single thyroid nodule: Secondary | ICD-10-CM | POA: Diagnosis not present

## 2019-09-14 ENCOUNTER — Other Ambulatory Visit: Payer: Self-pay | Admitting: Internal Medicine

## 2019-09-14 DIAGNOSIS — M5116 Intervertebral disc disorders with radiculopathy, lumbar region: Secondary | ICD-10-CM

## 2020-01-12 IMAGING — MR MRI HEAD WITHOUT AND WITH CONTRAST
12 of 19 series · 25 of 48 positions shown · IV contrast (gadavist)
Comparison: Prior CT from 08/30/2018.

CLINICAL DATA: Follow-up examination for abnormal CT scan,
pituitary mass.

EXAM:
MRI HEAD WITHOUT AND WITH CONTRAST
TECHNIQUE: Multiplanar, multiecho pulse sequences of the brain and surrounding
structures were obtained without and with intravenous contrast. A
pituitary protocol was utilized.
CONTRAST:  6 cc of Gadavist.

[Series 5: T1 · sagittal · 5.0mm · 0.62mm/px · 2 of 24 slices shown]
[im 1/24]
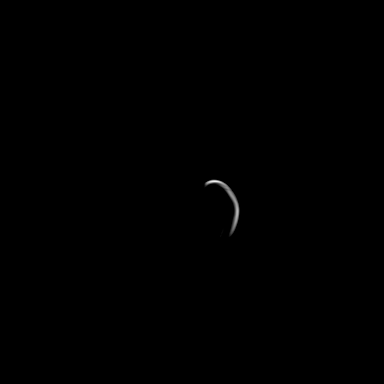
[im 24/24]
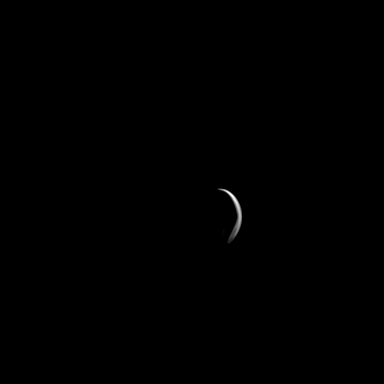

[Series 8: T2 · axial · 5.0mm · 0.53mm/px · z∈[-38,+106]mm · 2 of 25 slices shown]
[im 1/25]
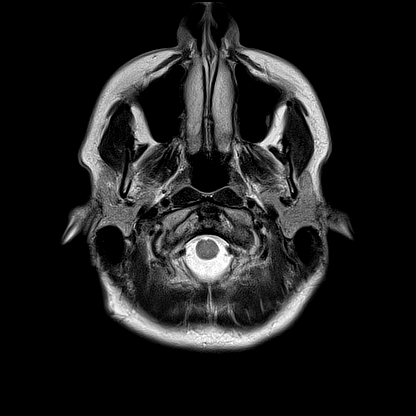
[im 25/25]
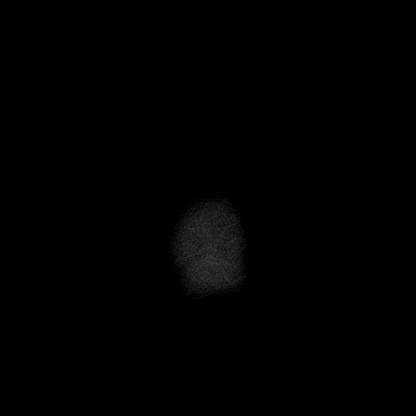

[Series 10: FLAIR · axial · 3.0mm · 0.53mm/px · z∈[-52,+109]mm · 4 of 55 slices shown]
[im 1/55]
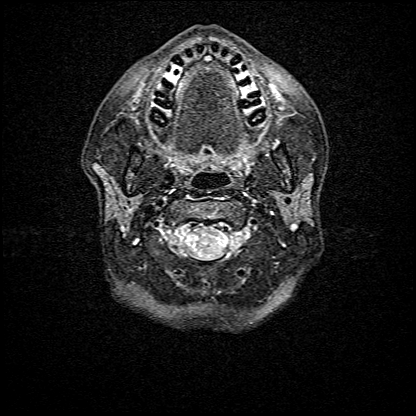
[im 19/55]
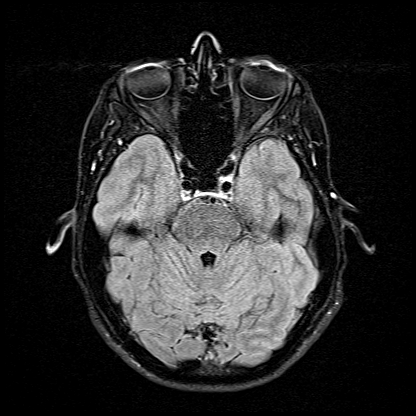
[im 37/55]
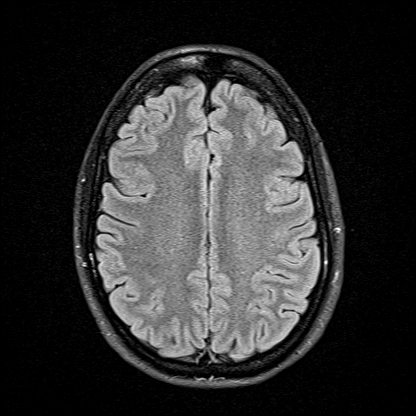
[im 55/55]
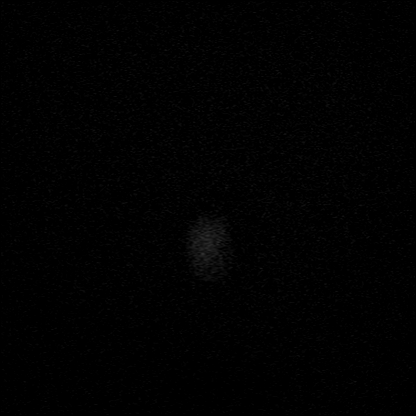

[Series 14: T1 post-contrast · coronal · 3.0mm · 0.28mm/px · 1 of 11 slices shown (1 of 9)]
[im 1/11]
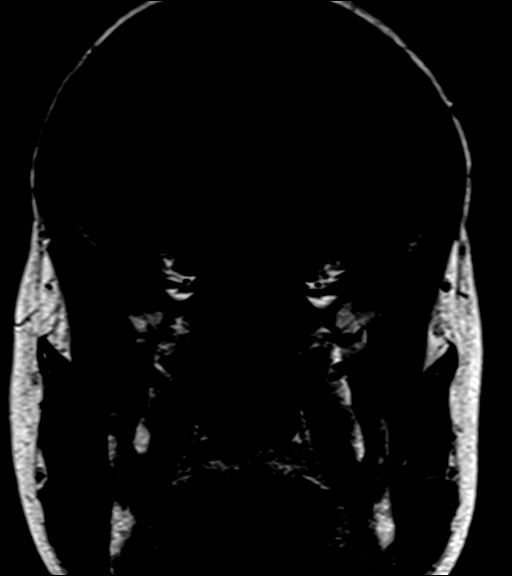

[Series 15: T1 post-contrast · coronal · 3.0mm · 0.28mm/px · 1 of 11 slices shown (2 of 9)]
[im 1/11]
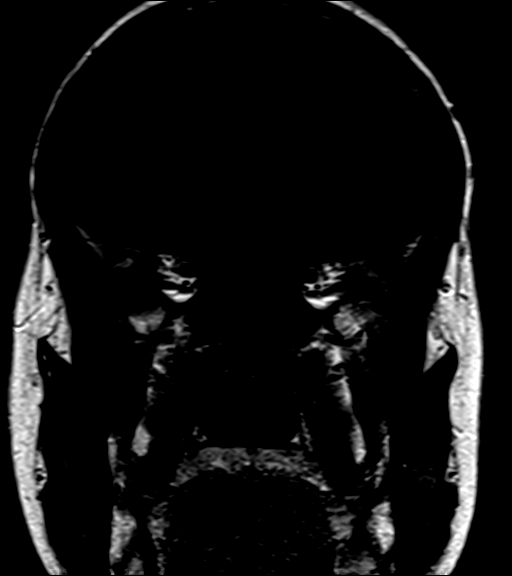

[Series 16: T1 post-contrast · coronal · 3.0mm · 0.28mm/px · 1 of 11 slices shown (3 of 9)]
[im 1/11]
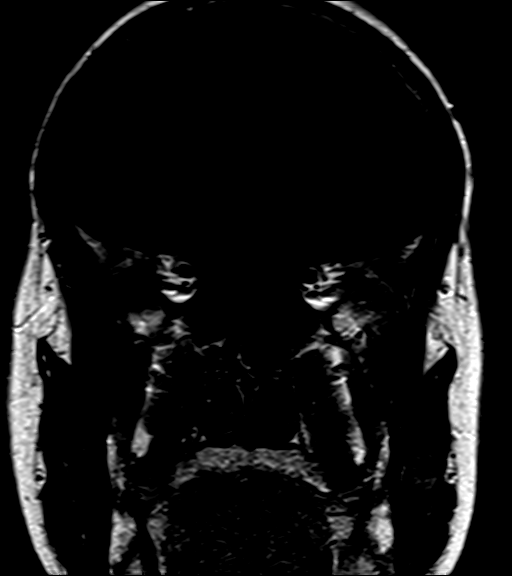

[Series 17: T1 post-contrast · coronal · 3.0mm · 0.28mm/px · 1 of 11 slices shown (4 of 9)]
[im 1/11]
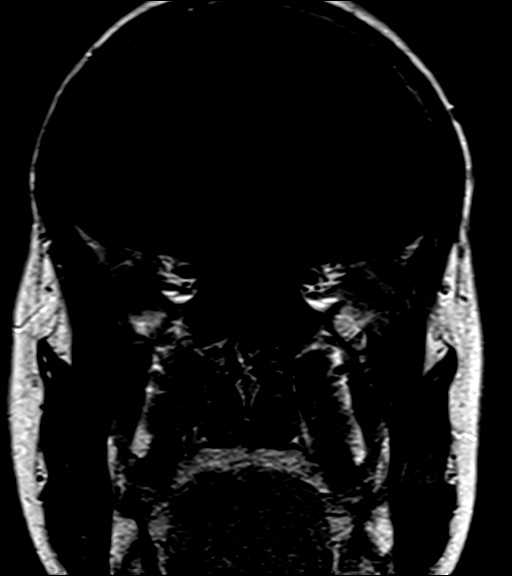

[Series 18: T1 post-contrast · coronal · 3.0mm · 0.28mm/px · 1 of 11 slices shown (5 of 9)]
[im 1/11]
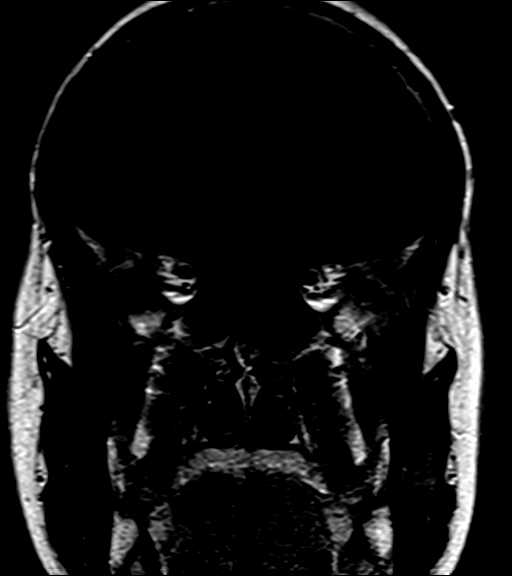

[Series 19: T1 post-contrast · coronal · 3.0mm · 0.21mm/px · 1 of 13 slices shown (6 of 9)]
[im 1/13]
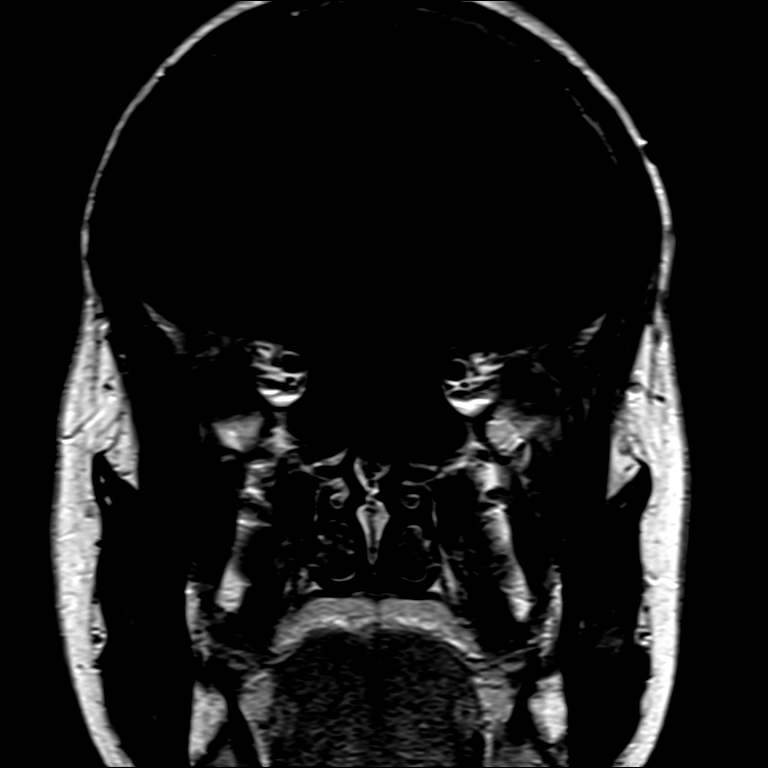

[Series 20: T1 post-contrast · sagittal · 3.0mm · 0.21mm/px · 1 of 13 slices shown (7 of 9)]
[im 1/13]
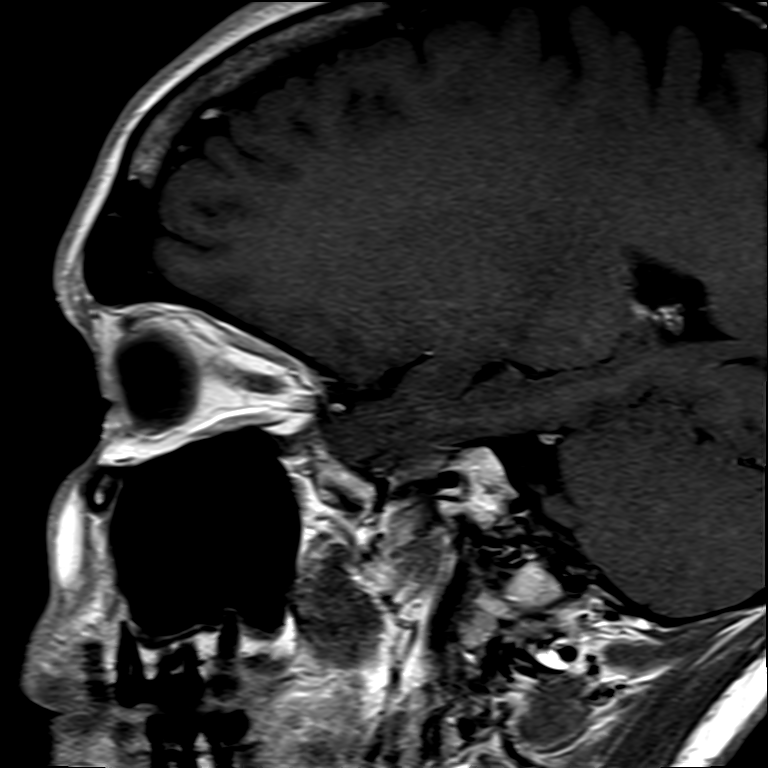

[Series 21: T1 post-contrast · axial · 1.0mm · 0.98mm/px · z∈[-69,+104]mm · 8 of 175 slices shown (8 of 9)]
[im 1/175]
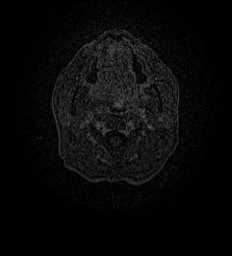
[im 27/175]
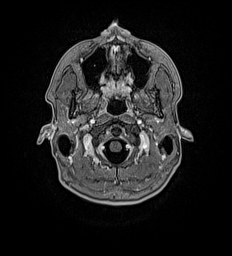
[im 54/175]
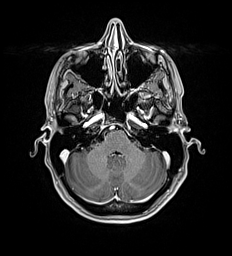
[im 81/175]
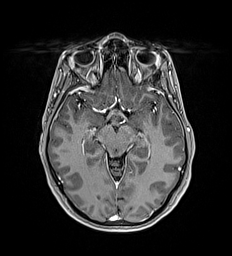
[im 94/175]
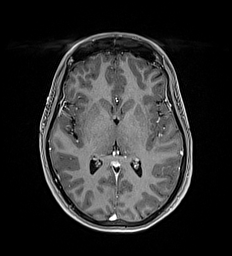
[im 121/175]
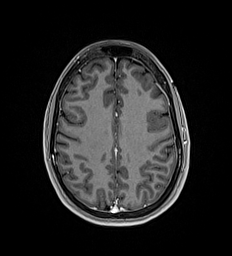
[im 148/175]
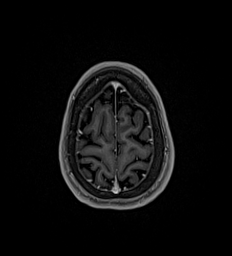
[im 175/175]
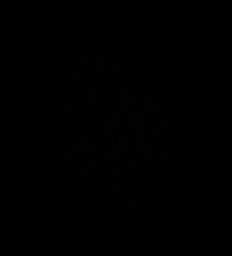

[Series 22: T1 post-contrast · coronal · 5.0mm · 0.57mm/px · 2 of 29 slices shown (9 of 9)]
[im 1/29]
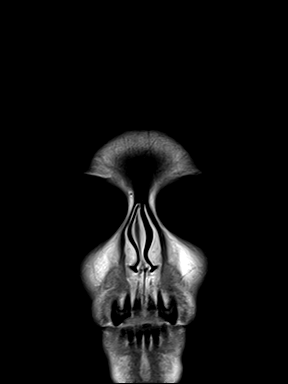
[im 29/29]
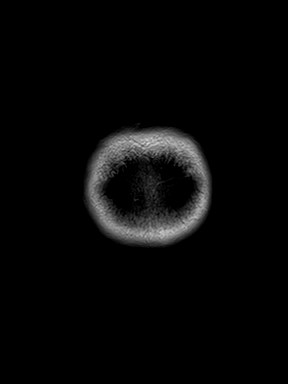

[25 of 48 positions shown; findings below may reference images not displayed]

FINDINGS: Brain: Cerebral volume within normal limits for age. No focal
parenchymal signal abnormality. No abnormal foci of restricted
diffusion to suggest acute or subacute ischemia. Gray-white matter
differentiation well maintained. No encephalomalacia to suggest
chronic infarction. No foci of susceptibility artifact to suggest
acute or chronic intracranial hemorrhage.

No mass lesion within the brain itself. No mass effect or midline
shift. Ventricles normal size without hydrocephalus. No extra-axial
fluid collection. No abnormal enhancement within the brain.

Dynamic thin section imaging through the pituitary gland and sella
was performed. Pituitary bright spot normally position. Pituitary
gland is diffusely enlarged with convex border superiorly,
accounting for abnormality seen on prior CT. Gland itself measures 9
mm in craniocaudad dimension. Following homogeneous enhancement
glandular enhancement no relative hypoenhancement to suggest micro
or macro adenoma. No other discrete pituitary lesion. Pituitary
stalk intact and relatively midline. Optic chiasm normally position
within the suprasellar cistern, although the superior margin of the
pituitary closely approximates the undersurface of the chiasm. No
abnormality within the adjacent cavernous sinus. Constellation of
findings compatible with pituitary hyperplasia.

Vascular: Normal intravascular enhancement seen throughout the
intracranial vasculature.

Skull and upper cervical spine: Craniocervical junction within
normal limits. Visualized upper cervical spine normal. Bone marrow
signal intensity within normal limits. No focal marrow replacing
lesions. Scalp soft tissues are normal.

Sinuses/Orbits: Globes orbital soft tissues within normal limits.
Paranasal sinuses are largely clear. No mastoid effusion. Inner ear
structures normal.

Other: None.
IMPRESSION: 1. Findings consistent with pituitary hyperplasia as detailed above.
No discrete pituitary mass or other abnormality identified. Finding
of uncertain etiology. Correlation with laboratory values and
history recommended.
2. Otherwise normal brain MRI.

## 2020-02-14 ENCOUNTER — Ambulatory Visit (INDEPENDENT_AMBULATORY_CARE_PROVIDER_SITE_OTHER): Payer: BLUE CROSS/BLUE SHIELD | Admitting: Dermatology

## 2020-02-14 ENCOUNTER — Encounter: Payer: Self-pay | Admitting: Dermatology

## 2020-02-14 ENCOUNTER — Other Ambulatory Visit: Payer: Self-pay

## 2020-02-14 DIAGNOSIS — L578 Other skin changes due to chronic exposure to nonionizing radiation: Secondary | ICD-10-CM

## 2020-02-14 DIAGNOSIS — D1800 Hemangioma unspecified site: Secondary | ICD-10-CM

## 2020-02-14 DIAGNOSIS — L820 Inflamed seborrheic keratosis: Secondary | ICD-10-CM | POA: Diagnosis not present

## 2020-02-14 DIAGNOSIS — L821 Other seborrheic keratosis: Secondary | ICD-10-CM | POA: Diagnosis not present

## 2020-02-14 DIAGNOSIS — B078 Other viral warts: Secondary | ICD-10-CM | POA: Diagnosis not present

## 2020-02-14 NOTE — Patient Instructions (Addendum)
Recommend daily broad spectrum sunscreen SPF 30+ to sun-exposed areas, reapply every 2 hours as needed. Call for new or changing lesions.   Recommend taking Heliocare sun protection supplement daily in sunny weather for additional sun protection. For maximum protection on the sunniest days, you can take up to 2 capsules of regular Heliocare OR take 1 capsule of Heliocare Ultra. For prolonged exposure (such as a full day in the sun), you can repeat your dose of the supplement 4 hours after your first dose. Heliocare can be purchased at [facility] or at [email or web address].   Recommend Amlactin cream  Discussed viral etiology and risk of spread.  Discussed multiple treatments may be required to clear warts.  Discussed possible post-treatment dyspigmentation and risk of recurrence. Recommend not shaving until warts are no longer present   Prior to procedure, discussed risks of blister formation, small wound, skin dyspigmentation, or rare scar following cryotherapy.  Liquid nitrogen was applied for 10-12 seconds to the skin lesion and the expected blistering or scabbing reaction explained. Do not pick at the area. Patient reminded to expect hypopigmented scars from the procedure. Return if lesion fails to fully resolve.  Cryotherapy Aftercare  . Wash gently with soap and water everyday.   Marland Kitchen Apply Vaseline and Band-Aid daily until healed.

## 2020-02-14 NOTE — Progress Notes (Signed)
Follow-Up Visit   Subjective  Regina [name] is a 39 y.o. female who presents for the following: Rash.  Patient presents today for rash on b/l lower legs, has been present for little over a year, has gotten worse in last month or so. Patient has tried Retinol cream and glycolic acid, neither of which helped.   The following portions of the chart were reviewed this encounter and updated as appropriate:  Tobacco  Allergies  Meds  Problems  Med Hx  Surg Hx  Fam Hx      Review of Systems:  No other skin or systemic complaints except as noted in HPI or Assessment and Plan.  Objective  Well appearing patient in no apparent distress; mood and affect are within normal limits.  A focused examination was performed including lower extremities, including the legs, feet, toes, and toenails. Relevant physical exam findings are noted in the Assessment and Plan.  Objective  Right Lower Leg - Anterior: Stuck-on, waxy, tan-brown papules and plaques  Objective  Left Leg x >30 (30), Right Leg x >30 (30): Verrucous papules  Objective  Right Knee - Anterior: Erythematous keratotic or waxy stuck-on papule or plaque.    Assessment & Plan  Seborrheic keratosis Right Lower Leg - Anterior  Benign-appearing.  Observation.  These are genetic benign skin lesions that are very common. They have been reported to appear in large number with a cancer diagnosis, although this link is controversial.  Call for new or changing lesions.  Reviewed today: CBC, CMP, and genetic screening labs from 07/27/19. Patient has a history of rectal cancer last year, currently clear. She also had a mammogram last year.  Recommend chemical peel (Perfect Derma Peel) for treatment of multiple thin SKs   Flat wart (60) Left Leg x >30 (30); Right Leg x >30 (30)  Discussed viral etiology and risk of spread.  Discussed multiple treatments may be required to clear warts.  Discussed possible post-treatment  dyspigmentation and risk of recurrence.  Cryotherapy today Prior to procedure, discussed risks of blister formation, small wound, skin dyspigmentation, or rare scar following cryotherapy.   Recommend not to shave legs while warts are present. Can wax but avoid until inflammation after cryotherapy is resolved.  Destruction of lesion - Left Leg x >30  Destruction method: cryotherapy   Informed consent: discussed and consent obtained   Lesion destroyed using liquid nitrogen: Yes   Outcome: patient tolerated procedure well with no complications   Post-procedure details: wound care instructions given    Inflamed seborrheic keratosis Right Knee - Anterior  Cryotherapy today Prior to procedure, discussed risks of blister formation, small wound, skin dyspigmentation, or rare scar following cryotherapy.    Destruction of lesion - Right Knee - Anterior  Destruction method: cryotherapy   Informed consent: discussed and consent obtained   Lesion destroyed using liquid nitrogen: Yes   Outcome: patient tolerated procedure well with no complications   Post-procedure details: wound care instructions given     Hemangiomas - Red papules - Discussed benign nature - Observe - Call for any changes  Seborrheic Keratoses - Stuck-on, waxy, tan-brown papules and plaques  - Discussed benign etiology and prognosis. - Observe - Call for any changes  Actinic Damage - diffuse scaly erythematous macules with underlying dyspigmentation - Recommend daily broad spectrum sunscreen SPF 30+ to sun-exposed areas, reapply every 2 hours as needed.  - Call for new or changing lesions. - Recommend return for FBSE given actinic damage, history of tanning  bed use, history of rectal cancer Return in about 1 month (around 03/16/2020) for Wart.  IDonzetta Kohut, CMA, am acting as scribe for Forest Gleason, MD .  Documentation: I have reviewed the above documentation for accuracy and completeness, and I agree with  the above.  Forest Gleason, MD

## 2020-03-19 ENCOUNTER — Ambulatory Visit: Payer: BLUE CROSS/BLUE SHIELD | Admitting: Dermatology

## 2020-03-21 ENCOUNTER — Ambulatory Visit (INDEPENDENT_AMBULATORY_CARE_PROVIDER_SITE_OTHER): Payer: 59 | Admitting: Dermatology

## 2020-03-21 ENCOUNTER — Encounter: Payer: Self-pay | Admitting: Dermatology

## 2020-03-21 ENCOUNTER — Other Ambulatory Visit: Payer: Self-pay

## 2020-03-21 DIAGNOSIS — L814 Other melanin hyperpigmentation: Secondary | ICD-10-CM | POA: Diagnosis not present

## 2020-03-21 DIAGNOSIS — B079 Viral wart, unspecified: Secondary | ICD-10-CM | POA: Diagnosis not present

## 2020-03-21 DIAGNOSIS — L821 Other seborrheic keratosis: Secondary | ICD-10-CM

## 2020-03-21 DIAGNOSIS — L810 Postinflammatory hyperpigmentation: Secondary | ICD-10-CM

## 2020-03-21 MED ORDER — CIMETIDINE 800 MG PO TABS: 800.0000 mg | ORAL_TABLET | Freq: Two times a day (BID) | ORAL | 2 refills | Status: DC

## 2020-03-21 NOTE — Progress Notes (Signed)
   Follow-Up Visit   Subjective  Regina [name] is a 39 y.o. female who presents for the following: Follow-up (Patient here today for 1 month wart follow up. ).  Flat warts treated at bilateral legs with LN2 at last visit. Patient advises she does not think they are any better and may have spread. She also feels that the freezing has left scars.   Patient advises there are some new spots on abdomen.   The following portions of the chart were reviewed this encounter and updated as appropriate:  Tobacco  Allergies  Meds  Problems  Med Hx  Surg Hx  Fam Hx      Review of Systems:  No other skin or systemic complaints except as noted in HPI or Assessment and Plan.  Objective  Well appearing patient in no apparent distress; mood and affect are within normal limits.  A focused examination was performed including bilateral legs, abdomen. Relevant physical exam findings are noted in the Assessment and Plan.  Objective  Legs, abdomen: Verrucous papules   Objective  Right Thigh - Anterior: Hyperpigmented macules   Assessment & Plan  Viral warts, unspecified type Legs, abdomen  Discussed viral etiology and risk of spread.  Discussed multiple treatments may be required to clear warts.  Discussed possible post-treatment dyspigmentation and risk of recurrence. Discussed treatment options with cantharone vs LN2 and/or candida antigen.   Cantharidin is a blistering agent that comes from a beetle.  It needs to be washed off in about 4 hours after application.  Although it is painless when applied in office, it may cause symptoms of mild pain and burning several hours later.  Treated areas will swell and turn red, and blisters may form.  Vaseline and a bandaid may be applied until wound has healed.  Once healed, the skin may remain temporarily discolored.  It can take weeks to months for pigmentation to return to normal.    Start cimetidine 800 mg BID Cont zinc Cantharone Plus  applied to areas at right thigh > 25 Candida Antigen injected to 3 sites at right thigh.  Lot # [id number] Exp: Apr 12 2021   cimetidine (TAGAMET) 800 MG tablet - Legs, abdomen  Destruction of lesion - Legs, abdomen Complexity: simple   Destruction method: chemical removal   Informed consent: discussed and consent obtained   Timeout:  patient name, date of birth, surgical site, and procedure verified Chemical destruction method: cantharidin   Application time:  4 hours Procedure instructions: patient instructed to wash and dry area   Outcome: patient tolerated procedure well with no complications   Post-procedure details: wound care instructions given    Postinflammatory hyperpigmentation Right Thigh - Anterior  Advised PIH occurs after inflammation and will resolve with time  Lentigines - Scattered tan macules - Discussed due to sun exposure - Benign, observe - Call for any changes  Seborrheic Keratoses - Many stuck-on, waxy, tan-brown papules and plaques  - Discussed benign etiology and prognosis. - Observe - Call for any changes  Return in about 4 weeks (around 04/18/2020).  Graciella Belton, RMA, am acting as scribe for Forest Gleason, MD .  Documentation: I have reviewed the above documentation for accuracy and completeness, and I agree with the above.  Forest Gleason, MD

## 2020-03-21 NOTE — Patient Instructions (Signed)
Instructions for After In-Office Application of Cantharidin  1. This is a strong medicine; please follow ALL instructions.  2. Gently wash off with soap and water in four hours or sooner s directed by your physician.  3. **WARNING** this medicine can cause severe blistering, blood blisters, infection, and/or scarring if it is not washed off as directed.  4. Your progress will be rechecked in 1-2 months; call sooner if there are any questions or problems.  Cantharidin is a blistering agent that comes from a beetle.  It needs to be washed off in about 4 hours after application.  Although it is painless when applied in office, it may cause symptoms of mild pain and burning several hours later.  Treated areas will swell and turn red, and blisters may form.  Vaseline and a bandaid may be applied until wound has healed.  Once healed, the skin may remain temporarily discolored.  It can take weeks to months for pigmentation to return to normal.  The molluscum may resolve with this topical treatment, but often, additional treatments may be required to clear molluscum.  It is recommended to keep the skin well-moisturized and avoid scratching affected area to help prevent spread of the molluscum.

## 2020-04-08 ENCOUNTER — Encounter: Payer: Self-pay | Admitting: Dermatology

## 2020-04-25 ENCOUNTER — Ambulatory Visit (INDEPENDENT_AMBULATORY_CARE_PROVIDER_SITE_OTHER): Payer: 59 | Admitting: Dermatology

## 2020-04-25 ENCOUNTER — Other Ambulatory Visit: Payer: Self-pay

## 2020-04-25 DIAGNOSIS — Q825 Congenital non-neoplastic nevus: Secondary | ICD-10-CM

## 2020-04-25 DIAGNOSIS — Z1283 Encounter for screening for malignant neoplasm of skin: Secondary | ICD-10-CM

## 2020-04-25 DIAGNOSIS — D1800 Hemangioma unspecified site: Secondary | ICD-10-CM

## 2020-04-25 DIAGNOSIS — L821 Other seborrheic keratosis: Secondary | ICD-10-CM

## 2020-04-25 DIAGNOSIS — D225 Melanocytic nevi of trunk: Secondary | ICD-10-CM

## 2020-04-25 DIAGNOSIS — B079 Viral wart, unspecified: Secondary | ICD-10-CM

## 2020-04-25 DIAGNOSIS — L814 Other melanin hyperpigmentation: Secondary | ICD-10-CM

## 2020-04-25 DIAGNOSIS — D489 Neoplasm of uncertain behavior, unspecified: Secondary | ICD-10-CM | POA: Diagnosis not present

## 2020-04-25 DIAGNOSIS — Z86018 Personal history of other benign neoplasm: Secondary | ICD-10-CM

## 2020-04-25 DIAGNOSIS — L578 Other skin changes due to chronic exposure to nonionizing radiation: Secondary | ICD-10-CM

## 2020-04-25 DIAGNOSIS — D229 Melanocytic nevi, unspecified: Secondary | ICD-10-CM

## 2020-04-25 MED ORDER — FLUOROURACIL 5 % EX CREA: TOPICAL_CREAM | Freq: Two times a day (BID) | CUTANEOUS | 1 refills | Status: DC

## 2020-04-25 NOTE — Progress Notes (Signed)
Follow-Up Visit   Subjective  Regina [name] is a 39 y.o. female who presents for the following: Patient here for full body skin exam and skin cancer screening.  No hx of skin cancer. Hx of multiple dysplastic nevi. Pt reports nothing new or changing of concern today.  Also here for 1 mo wart f/u. Pt treating with cimetidine 800 mg BID and zinc. Treated in office last visit with cantharone plus and candida injections. Pt has not seen a noticeable improvement.  The following portions of the chart were reviewed this encounter and updated as appropriate:  Tobacco  Allergies  Meds  Problems  Med Hx  Surg Hx  Fam Hx      Review of Systems: No other skin or systemic complaints except as noted in HPI or Assessment and Plan.   Objective  Well appearing patient in no apparent distress; mood and affect are within normal limits.  A full examination was performed including scalp, head, eyes, ears, nose, lips, neck, chest, axillae, abdomen, back, buttocks, bilateral upper extremities, bilateral lower extremities, hands, feet, fingers, toes, fingernails, and toenails. All findings within normal limits unless otherwise noted below.  Objective  Right Thigh - Anterior: Numerous verrucous papules  Objective  left medial thigh: Tan-brown plaque  Objective  left low back below the excision site: scar         Objective  Left Lower Back: 0.5 cm light brown thin papule with perifollicular dropout  Images      Assessment & Plan  Viral warts, unspecified type Right Thigh - Anterior  Chronic condition present over 1 year (see previous notes). Condition is significantly bothersome to patient. Currently flared.  Start fluorouracil 5% cream, Apply a thin layer twice a day to 1/4 of single leg (e.g. right anterior thigh) until warts are red and crusted.   Once an areas becomes red and crusty, stop treatment in that area and can move to a different area.   Reviewed course  of treatment and expected reaction.  Patient advised to expect inflammation and crusting and advised that erosions are possible.    Discussed viral etiology and risk of spread.  Discussed multiple treatments may be required to clear warts.   fluorouracil (EFUDEX) 5 % cream - Right Thigh - Anterior  Other Related Medications cimetidine (TAGAMET) 800 MG tablet  Congenital non-neoplastic nevus left medial thigh  Benign-appearing.  Observation.  Call clinic for new or changing moles.  Recommend daily use of broad spectrum spf 30+ sunscreen to sun-exposed areas.    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 Plan shave removal at left low back below the excision site at follow-up (deferred today due to holidays and no visible evidence of recurrence on exam today).  Nevus Left Lower Back  Benign-appearing.  Recheck on follow-up.  Call clinic for new or changing lesions.  Recommend daily use of broad spectrum spf 30+ sunscreen to sun-exposed areas.    Lentigines - Scattered tan macules - Discussed due to sun exposure - Benign, observe - Call for any changes  Seborrheic Keratoses - Stuck-on, waxy, tan-brown papules and plaques  - Discussed benign etiology and prognosis. - Observe - Call for any changes  Melanocytic Nevi - Tan-brown and/or pink-flesh-colored symmetric macules and papules - Benign appearing on exam today - Observation - Call clinic for new or changing moles - Recommend daily use of broad spectrum spf 30+ sunscreen to sun-exposed  areas.   Hemangiomas - Red papules - Discussed benign nature - Observe - Call for any changes  Actinic Damage - Chronic, secondary to cumulative UV/sun exposure - diffuse scaly erythematous macules with underlying dyspigmentation - Recommend daily broad spectrum sunscreen SPF 30+ to sun-exposed areas, reapply every 2 hours as needed.  - Call for  new or changing lesions.  Skin cancer screening performed today.  History of Dysplastic Nevi - No evidence of recurrence today - Recommend regular full body skin exams - Recommend daily broad spectrum sunscreen SPF 30+ to sun-exposed areas, reapply every 2 hours as needed.  - Call if any new or changing lesions are noted between office visits  Return in about 6 weeks (around 06/06/2020) for wart f/u, recheck nevus.   I, Harriett Sine, CMA, am acting as scribe for Forest Gleason, MD.   Documentation: I have reviewed the above documentation for accuracy and completeness, and I agree with the above.  Forest Gleason, MD

## 2020-04-25 NOTE — Patient Instructions (Addendum)
Recommend daily broad spectrum sunscreen SPF 30+ to sun-exposed areas, reapply every 2 hours as needed. Call for new or changing lesions.  Melanoma ABCDEs  Melanoma is the most dangerous type of skin cancer, and is the leading cause of death from skin disease.  You are more likely to develop melanoma if you:  Have light-colored skin, light-colored eyes, or red or blond hair  Spend a lot of time in the sun  Tan regularly, either outdoors or in a tanning bed  Have had blistering sunburns, especially during childhood  Have a close family member who has had a melanoma  Have atypical moles or large birthmarks  Early detection of melanoma is key since treatment is typically straightforward and cure rates are extremely high if we catch it early.   The first sign of melanoma is often a change in a mole or a new dark spot.  The ABCDE system is a way of remembering the signs of melanoma.  A for asymmetry:  The two halves do not match. B for border:  The edges of the growth are irregular. C for color:  A mixture of colors are present instead of an even brown color. D for diameter:  Melanomas are usually (but not always) greater than 6mm - the size of a pencil eraser. E for evolution:  The spot keeps changing in size, shape, and color.  Please check your skin once per month between visits. You can use a small mirror in front and a large mirror behind you to keep an eye on the back side or your body.   If you see any new or changing lesions before your next follow-up, please call to schedule a visit.  Please continue daily skin protection including broad spectrum sunscreen SPF 30+ to sun-exposed areas, reapplying every 2 hours as needed when you're outdoors.     Recommend taking Heliocare sun protection supplement daily in sunny weather for additional sun protection. For maximum protection on the sunniest days, you can take up to 2 capsules of regular Heliocare OR take 1 capsule of Heliocare  Ultra. For prolonged exposure (such as a full day in the sun), you can repeat your dose of the supplement 4 hours after your first dose. Heliocare can be purchased at [facility] or at [email or web address].     

## 2020-04-30 ENCOUNTER — Encounter: Payer: Self-pay | Admitting: Dermatology

## 2020-05-27 ENCOUNTER — Ambulatory Visit: Payer: BLUE CROSS/BLUE SHIELD | Admitting: Dermatology

## 2020-06-11 ENCOUNTER — Ambulatory Visit: Payer: 59 | Admitting: Dermatology

## 2020-06-13 ENCOUNTER — Other Ambulatory Visit: Payer: Self-pay | Admitting: Obstetrics & Gynecology

## 2020-06-13 DIAGNOSIS — Z1231 Encounter for screening mammogram for malignant neoplasm of breast: Secondary | ICD-10-CM

## 2020-06-15 ENCOUNTER — Other Ambulatory Visit: Payer: Self-pay | Admitting: Dermatology

## 2020-06-15 DIAGNOSIS — B079 Viral wart, unspecified: Secondary | ICD-10-CM

## 2020-06-17 ENCOUNTER — Ambulatory Visit: Payer: 59 | Admitting: Dermatology

## 2020-07-05 ENCOUNTER — Other Ambulatory Visit: Payer: Self-pay

## 2020-07-05 ENCOUNTER — Other Ambulatory Visit: Payer: Self-pay | Admitting: Obstetrics & Gynecology

## 2020-07-05 ENCOUNTER — Ambulatory Visit
Admission: RE | Admit: 2020-07-05 | Discharge: 2020-07-05 | Disposition: A | Discharge status: Home / Self Care | Payer: 59 | Source: Ambulatory Visit | Attending: Obstetrics & Gynecology | Admitting: Obstetrics & Gynecology

## 2020-07-05 DIAGNOSIS — Z1231 Encounter for screening mammogram for malignant neoplasm of breast: Secondary | ICD-10-CM

## 2021-06-09 ENCOUNTER — Other Ambulatory Visit: Payer: Self-pay | Admitting: Internal Medicine

## 2021-06-09 ENCOUNTER — Other Ambulatory Visit: Payer: Self-pay | Admitting: Family Medicine

## 2021-06-09 DIAGNOSIS — R61 Generalized hyperhidrosis: Secondary | ICD-10-CM

## 2021-06-09 DIAGNOSIS — E236 Other disorders of pituitary gland: Secondary | ICD-10-CM

## 2021-06-09 DIAGNOSIS — N6452 Nipple discharge: Secondary | ICD-10-CM

## 2021-06-09 DIAGNOSIS — N6315 Unspecified lump in the right breast, overlapping quadrants: Secondary | ICD-10-CM

## 2021-06-09 DIAGNOSIS — R519 Headache, unspecified: Secondary | ICD-10-CM

## 2021-06-23 ENCOUNTER — Ambulatory Visit
Admission: RE | Admit: 2021-06-23 | Discharge: 2021-06-23 | Disposition: A | Discharge status: Home / Self Care | Payer: Self-pay | Source: Ambulatory Visit | Attending: Family Medicine | Admitting: Family Medicine

## 2021-06-23 ENCOUNTER — Other Ambulatory Visit: Payer: Self-pay

## 2021-06-23 DIAGNOSIS — N6452 Nipple discharge: Secondary | ICD-10-CM

## 2021-06-23 DIAGNOSIS — N6315 Unspecified lump in the right breast, overlapping quadrants: Secondary | ICD-10-CM

## 2021-06-30 ENCOUNTER — Other Ambulatory Visit: Payer: Self-pay | Admitting: Family Medicine

## 2021-06-30 DIAGNOSIS — N6310 Unspecified lump in the right breast, unspecified quadrant: Secondary | ICD-10-CM

## 2021-07-08 ENCOUNTER — Other Ambulatory Visit: Payer: Self-pay | Admitting: Family Medicine

## 2021-07-08 ENCOUNTER — Ambulatory Visit
Admission: RE | Admit: 2021-07-08 | Discharge: 2021-07-08 | Disposition: A | Discharge status: Home / Self Care | Payer: 59 | Source: Ambulatory Visit | Attending: Family Medicine | Admitting: Family Medicine

## 2021-07-08 ENCOUNTER — Other Ambulatory Visit: Payer: Self-pay

## 2021-07-08 DIAGNOSIS — N6312 Unspecified lump in the right breast, upper inner quadrant: Secondary | ICD-10-CM | POA: Diagnosis not present

## 2021-07-08 DIAGNOSIS — N6310 Unspecified lump in the right breast, unspecified quadrant: Secondary | ICD-10-CM

## 2021-07-08 DIAGNOSIS — N6315 Unspecified lump in the right breast, overlapping quadrants: Secondary | ICD-10-CM | POA: Insufficient documentation

## 2022-02-23 DIAGNOSIS — G43109 Migraine with aura, not intractable, without status migrainosus: Secondary | ICD-10-CM | POA: Diagnosis not present

## 2022-02-23 DIAGNOSIS — J309 Allergic rhinitis, unspecified: Secondary | ICD-10-CM | POA: Diagnosis not present

## 2022-02-23 DIAGNOSIS — G4700 Insomnia, unspecified: Secondary | ICD-10-CM | POA: Diagnosis not present

## 2022-02-23 DIAGNOSIS — F411 Generalized anxiety disorder: Secondary | ICD-10-CM | POA: Diagnosis not present

## 2022-03-04 DIAGNOSIS — R399 Unspecified symptoms and signs involving the genitourinary system: Secondary | ICD-10-CM | POA: Diagnosis not present

## 2022-03-06 ENCOUNTER — Other Ambulatory Visit: Payer: Self-pay | Admitting: Family Medicine

## 2022-03-06 ENCOUNTER — Ambulatory Visit
Admission: RE | Admit: 2022-03-06 | Discharge: 2022-03-06 | Disposition: A | Discharge status: Home / Self Care | Payer: 59 | Source: Ambulatory Visit | Attending: Family Medicine | Admitting: Family Medicine

## 2022-03-06 DIAGNOSIS — N200 Calculus of kidney: Secondary | ICD-10-CM | POA: Diagnosis not present

## 2022-03-06 DIAGNOSIS — R1030 Lower abdominal pain, unspecified: Secondary | ICD-10-CM

## 2022-03-06 DIAGNOSIS — K828 Other specified diseases of gallbladder: Secondary | ICD-10-CM | POA: Diagnosis not present

## 2022-03-06 DIAGNOSIS — D259 Leiomyoma of uterus, unspecified: Secondary | ICD-10-CM | POA: Diagnosis not present

## 2022-03-06 DIAGNOSIS — R109 Unspecified abdominal pain: Secondary | ICD-10-CM | POA: Diagnosis not present

## 2022-03-06 DIAGNOSIS — R350 Frequency of micturition: Secondary | ICD-10-CM | POA: Diagnosis not present

## 2022-03-06 DIAGNOSIS — R509 Fever, unspecified: Secondary | ICD-10-CM | POA: Diagnosis not present

## 2022-03-30 DIAGNOSIS — D251 Intramural leiomyoma of uterus: Secondary | ICD-10-CM | POA: Diagnosis not present

## 2022-03-30 DIAGNOSIS — Z01411 Encounter for gynecological examination (general) (routine) with abnormal findings: Secondary | ICD-10-CM | POA: Diagnosis not present

## 2022-03-30 DIAGNOSIS — Z124 Encounter for screening for malignant neoplasm of cervix: Secondary | ICD-10-CM | POA: Diagnosis not present

## 2022-09-01 DIAGNOSIS — R300 Dysuria: Secondary | ICD-10-CM | POA: Diagnosis not present

## 2022-12-17 ENCOUNTER — Ambulatory Visit: Payer: 59 | Admitting: Dermatology

## 2022-12-17 DIAGNOSIS — D224 Melanocytic nevi of scalp and neck: Secondary | ICD-10-CM

## 2022-12-17 DIAGNOSIS — L821 Other seborrheic keratosis: Secondary | ICD-10-CM

## 2022-12-17 DIAGNOSIS — C439 Malignant melanoma of skin, unspecified: Secondary | ICD-10-CM

## 2022-12-17 DIAGNOSIS — Z86018 Personal history of other benign neoplasm: Secondary | ICD-10-CM | POA: Diagnosis not present

## 2022-12-17 DIAGNOSIS — L814 Other melanin hyperpigmentation: Secondary | ICD-10-CM

## 2022-12-17 DIAGNOSIS — B079 Viral wart, unspecified: Secondary | ICD-10-CM | POA: Diagnosis not present

## 2022-12-17 DIAGNOSIS — D2239 Melanocytic nevi of other parts of face: Secondary | ICD-10-CM

## 2022-12-17 DIAGNOSIS — Z7189 Other specified counseling: Secondary | ICD-10-CM

## 2022-12-17 DIAGNOSIS — Z1283 Encounter for screening for malignant neoplasm of skin: Secondary | ICD-10-CM

## 2022-12-17 DIAGNOSIS — C4371 Malignant melanoma of right lower limb, including hip: Secondary | ICD-10-CM | POA: Diagnosis not present

## 2022-12-17 DIAGNOSIS — Z79899 Other long term (current) drug therapy: Secondary | ICD-10-CM

## 2022-12-17 DIAGNOSIS — L820 Inflamed seborrheic keratosis: Secondary | ICD-10-CM

## 2022-12-17 DIAGNOSIS — W908XXA Exposure to other nonionizing radiation, initial encounter: Secondary | ICD-10-CM

## 2022-12-17 DIAGNOSIS — L578 Other skin changes due to chronic exposure to nonionizing radiation: Secondary | ICD-10-CM | POA: Diagnosis not present

## 2022-12-17 DIAGNOSIS — D489 Neoplasm of uncertain behavior, unspecified: Secondary | ICD-10-CM

## 2022-12-17 DIAGNOSIS — D229 Melanocytic nevi, unspecified: Secondary | ICD-10-CM

## 2022-12-17 HISTORY — DX: Malignant melanoma of skin, unspecified: C43.9

## 2022-12-17 NOTE — Patient Instructions (Addendum)
Seborrheic Keratosis  What causes seborrheic keratoses? Seborrheic keratoses are harmless, common skin growths that first appear during adult life.  As time goes by, more growths appear.  Some people may develop a large number of them.  Seborrheic keratoses appear on both covered and uncovered body parts.  They are not caused by sunlight.  The tendency to develop seborrheic keratoses can be inherited.  They vary in color from skin-colored to gray, brown, or even black.  They can be either smooth or have a rough, warty surface.   Seborrheic keratoses are superficial and look as if they were stuck on the skin.  Under the microscope this type of keratosis looks like layers upon layers of skin.  That is why at times the top layer may seem to fall off, but the rest of the growth remains and re-grows.    Treatment Seborrheic keratoses do not need to be treated, but can easily be removed in the office.  Seborrheic keratoses often cause symptoms when they rub on clothing or jewelry.  Lesions can be in the way of shaving.  If they become inflamed, they can cause itching, soreness, or burning.  Removal of a seborrheic keratosis can be accomplished by freezing, burning, or surgery. If any spot bleeds, scabs, or grows rapidly, please return to have it checked, as these can be an indication of a skin cancer.  Cryotherapy Aftercare  Wash gently with soap and water everyday.   Apply Vaseline and Band-Aid daily until healed.    Biopsy Wound Care Instructions  Leave the original bandage on for 24 hours if possible.  If the bandage becomes soaked or soiled before that time, it is OK to remove it and examine the wound.  A small amount of post-operative bleeding is normal.  If excessive bleeding occurs, remove the bandage, place gauze over the site and apply continuous pressure (no peeking) over the area for 30 minutes. If this does not work, please call our clinic as soon as possible or page your doctor if it is  after hours.   Once a day, cleanse the wound with soap and water. It is fine to shower. If a thick crust develops you may use a Q-tip dipped into dilute hydrogen peroxide (mix 1:1 with water) to dissolve it.  Hydrogen peroxide can slow the healing process, so use it only as needed.    After washing, apply petroleum jelly (Vaseline) or an antibiotic ointment if your doctor prescribed one for you, followed by a bandage.    For best healing, the wound should be covered with a layer of ointment at all times. If you are not able to keep the area covered with a bandage to hold the ointment in place, this may mean re-applying the ointment several times a day.  Continue this wound care until the wound has healed and is no longer open.   Itching and mild discomfort is normal during the healing process. However, if you develop pain or severe itching, please call our office.   If you have any discomfort, you can take Tylenol (acetaminophen) or ibuprofen as directed on the bottle. (Please do not take these if you have an allergy to them or cannot take them for another reason).  Some redness, tenderness and white or yellow material in the wound is normal healing.  If the area becomes very sore and red, or develops a thick yellow-green material (pus), it may be infected; please notify us.    If you have stitches,  return to clinic as directed to have the stitches removed. You will continue wound care for 2-3 days after the stitches are removed.   Wound healing continues for up to one year following surgery. It is not unusual to experience pain in the scar from time to time during the interval.  If the pain becomes severe or the scar thickens, you should notify the office.    A slight amount of redness in a scar is expected for the first six months.  After six months, the redness will fade and the scar will soften and fade.  The color difference becomes less noticeable with time.  If there are any problems, return  for a post-op surgery check at your earliest convenience.  To improve the appearance of the scar, you can use silicone scar gel, cream, or sheets (such as Mederma or Serica) every night for up to one year. These are available over the counter (without a prescription).  Please call our office at [phone] for any questions or concerns.  Melanoma ABCDEs  Melanoma is the most dangerous type of skin cancer, and is the leading cause of death from skin disease.  You are more likely to develop melanoma if you: Have light-colored skin, light-colored eyes, or red or blond hair Spend a lot of time in the sun Tan regularly, either outdoors or in a tanning bed Have had blistering sunburns, especially during childhood Have a close family member who has had a melanoma Have atypical moles or large birthmarks  Early detection of melanoma is key since treatment is typically straightforward and cure rates are extremely high if we catch it early.   The first sign of melanoma is often a change in a mole or a new dark spot.  The ABCDE system is a way of remembering the signs of melanoma.  A for asymmetry:  The two halves do not match. B for border:  The edges of the growth are irregular. C for color:  A mixture of colors are present instead of an even brown color. D for diameter:  Melanomas are usually (but not always) greater than 6mm - the size of a pencil eraser. E for evolution:  The spot keeps changing in size, shape, and color.  Please check your skin once per month between visits. You can use a small mirror in front and a large mirror behind you to keep an eye on the back side or your body.   If you see any new or changing lesions before your next follow-up, please call to schedule a visit.  Please continue daily skin protection including broad spectrum sunscreen SPF 30+ to sun-exposed areas, reapplying every 2 hours as needed when you're outdoors.   Staying in the shade or wearing long  sleeves, sun glasses (UVA+UVB protection) and wide brim hats (4-inch brim around the entire circumference of the hat) are also recommended for sun protection.    Due to recent changes in healthcare laws, you may see results of your pathology and/or laboratory studies on MyChart before the doctors have had a chance to review them. We understand that in some cases there may be results that are confusing or concerning to you. Please understand that not all results are received at the same time and often the doctors may need to interpret multiple results in order to provide you with the best plan of care or course of treatment. Therefore, we ask that you please give [place] 2 business days to thoroughly review all your  results before contacting the office for clarification. Should we see a critical lab result, you will be contacted sooner.   If You Need Anything After Your Visit  If you have any questions or concerns for your doctor, please call our main line at [phone] and press option 4 to reach your doctor's medical assistant. If no one answers, please leave a voicemail as directed and we will return your call as soon as possible. Messages left after 4 pm will be answered the following business day.   You may also send [place] a message via MyChart. We typically respond to MyChart messages within 1-2 business days.  For prescription refills, please ask your pharmacy to contact our office. Our fax number is [phone].  If you have an urgent issue when the clinic is closed that cannot wait until the next business day, you can page your doctor at the number below.    Please note that while we do our best to be available for urgent issues outside of office hours, we are not available 24/7.   If you have an urgent issue and are unable to reach [place], you may choose to seek medical care at your doctor's office, retail clinic, urgent care center, or emergency room.  If you have a medical emergency, please  immediately call 911 or go to the emergency department.  Pager Numbers  - Dr. Gwen Pounds: [phone]  - Dr. Roseanne Reno: [phone]  In the event of inclement weather, please call our main line at [phone] for an update on the status of any delays or closures.  Dermatology Medication Tips: Please keep the boxes that topical medications come in in order to help keep track of the instructions about where and how to use these. Pharmacies typically print the medication instructions only on the boxes and not directly on the medication tubes.   If your medication is too expensive, please contact our office at [phone] option 4 or send [place] a message through MyChart.   We are unable to tell what your co-pay for medications will be in advance as this is different depending on your insurance coverage. However, we may be able to find a substitute medication at lower cost or fill out paperwork to get insurance to cover a needed medication.   If a prior authorization is required to get your medication covered by your insurance company, please allow [place] 1-2 business days to complete this process.  Drug prices often vary depending on where the prescription is filled and some pharmacies may offer cheaper prices.  The website www.goodrx.com contains coupons for medications through different pharmacies. The prices here do not account for what the cost may be with help from insurance (it may be cheaper with your insurance), but the website can give you the price if you did not use any insurance.  - You can print the associated coupon and take it with your prescription to the pharmacy.  - You may also stop by our office during regular business hours and pick up a GoodRx coupon card.  - If you need your prescription sent electronically to a different pharmacy, notify our office through [facility] or by phone at [phone] option 4.     Si Usted Necesita Algo Despus de Su Visita  Tambin  puede enviarnos un mensaje a travs de Clinical cytogeneticist. Por lo general respondemos a los mensajes de MyChart en el transcurso de 1 a 2 das hbiles.  Para renovar recetas, por favor pida a su farmacia que  se ponga en contacto con nuestra oficina. [name] de fax es [place] [phone].  Si tiene un asunto urgente cuando la clnica est cerrada y que no puede esperar hasta el siguiente da hbil, puede llamar/localizar a su doctor(a) al nmero que aparece a continuacin.   Por favor, tenga en cuenta que aunque hacemos todo lo posible para estar disponibles para asuntos urgentes fuera del horario de [place], no estamos disponibles las 24 horas del da, los 7 [address]  [address] de la [place].   Si tiene un problema urgente y no puede comunicarse con nosotros, puede optar por buscar atencin mdica  en el consultorio de su doctor(a), en una clnica privada, en un centro de atencin urgente o en una sala de emergencias.  Si tiene Engineer, drilling, por favor llame inmediatamente al 911 o vaya a la sala de emergencias.  Nmeros de bper  - Dr. Gwen Pounds: [phone]  - Dra. Roseanne Reno: [phone]  En caso de inclemencias del [place], por favor llame a Lacy Duverney principal al [phone] para una actualizacin sobre el [place] de cualquier retraso o cierre.  Consejos para la medicacin en dermatologa: Por favor, guarde las cajas en las que vienen los medicamentos de uso tpico para ayudarle a seguir las instrucciones sobre dnde y cmo usarlos. Las farmacias generalmente imprimen las instrucciones del medicamento slo en las cajas y no directamente en los tubos del [place].   Si su medicamento es muy caro, por favor, pngase en contacto con Rolm Gala llamando al [phone] y presione la opcin 4 o envenos un mensaje a travs de Clinical cytogeneticist.   No podemos decirle cul ser su copago por los medicamentos por adelantado ya que esto es diferente dependiendo de la cobertura de su seguro. Sin embargo, es  posible que podamos encontrar un medicamento sustituto a Audiological scientist un formulario para que el seguro cubra el medicamento que se considera necesario.   Si se requiere una autorizacin previa para que su compaa de seguros [place] su medicamento, por favor permtanos de 1 a 2 das hbiles para completar [address]  Los precios de los medicamentos varan con frecuencia dependiendo del Environmental consultant de dnde se surte la receta y alguna farmacias pueden ofrecer precios ms baratos.  El sitio web www.goodrx.com tiene cupones para medicamentos de Health and safety inspector. Los precios aqu no tienen en cuenta lo que podra costar con la ayuda del seguro (puede ser ms barato con su seguro), pero el sitio web puede darle el precio si no utiliz Tourist information centre manager.  - Puede imprimir el cupn correspondiente y llevarlo con su receta a la farmacia.  - Tambin puede pasar por nuestra oficina durante el horario de atencin regular y Education officer, museum una tarjeta de cupones de GoodRx.  - Si necesita que su receta se enve electrnicamente a una farmacia diferente, informe a nuestra oficina a travs de MyChart de [place] o por telfono llamando al [phone] y presione la opcin 4.

## 2022-12-17 NOTE — Progress Notes (Signed)
Follow-Up Visit   Subjective  Regina [name] is a 42 y.o. female who presents for the following: Skin Cancer Screening and Full Body Skin Exam hx of dysplastic.  Pt last seen 04/2020 by Dr Neale Burly. Spot at back of right leg that feels has changed in last 2 months with darkening and thickening Spot beside left ear   The patient presents for Total-Body Skin Exam (TBSE) for skin cancer screening and mole check. The patient has spots, moles and lesions to be evaluated, some may be new or changing and the patient may have concern these could be cancer.  The following portions of the chart were reviewed this encounter and updated as appropriate: medications, allergies, medical history  Review of Systems:  No other skin or systemic complaints except as noted in HPI or Assessment and Plan.             Objective  Well appearing patient in no apparent distress; mood and affect are within normal limits.  A full examination was performed including scalp, head, eyes, ears, nose, lips, neck, chest, axillae, abdomen, back, buttocks, bilateral upper extremities, bilateral lower extremities, hands, feet, fingers, toes, fingernails, and toenails. All findings within normal limits unless otherwise noted below.   Relevant physical exam findings are noted in the Assessment and Plan.  right mid to inferior calf 0.5 cm dark brown papule          Left Thigh - Posterior, Right Thigh - Posterior Verrucous papules -- Discussed viral etiology and contagion.   19 cm inferior to popliteal  About  24 cm superior to bottom of the heel   right popliteal x3 Erythematous stuck-on, waxy papule or plaque   Assessment & Plan   SKIN CANCER SCREENING PERFORMED TODAY.  ACTINIC DAMAGE - Chronic condition, secondary to cumulative UV/sun exposure - diffuse scaly erythematous macules with underlying dyspigmentation - Recommend daily broad spectrum sunscreen SPF 30+ to sun-exposed areas, reapply  every 2 hours as needed.  - Staying in the shade or wearing long sleeves, sun glasses (UVA+UVB protection) and wide brim hats (4-inch brim around the entire circumference of the hat) are also recommended for sun protection.  - Call for new or changing lesions.  LENTIGINES, SEBORRHEIC KERATOSES, HEMANGIOMAS - Benign normal skin lesions - Benign-appearing - Call for any changes  MELANOCYTIC NEVI - Tan-brown and/or pink-flesh-colored symmetric macules and papules - Benign appearing on exam today - Observation - Call clinic for new or changing moles - Recommend daily use of broad spectrum spf 30+ sunscreen to sun-exposed areas.   Nevus  Right postauricular  0.4 cm regular brown macule Photo today  Left proximal mandible  0.4 x 0.3 brown flat papule regular  Photo today  Benign-appearing.  Observation.  Call clinic for new or changing lesions.  Recommend daily use of broad spectrum spf 30+ sunscreen to sun-exposed areas.   Congenital non-neoplastic nevus left medial thigh See photo Exam  Tan-brown plaque    Benign-appearing.  Observation.  Call clinic for new or changing moles.  Recommend daily use of broad spectrum spf 30+ sunscreen to sun-exposed areas  HISTORY OF DYSPLASTIC NEVUS Multiple locations see history  No evidence of recurrence today Recommend regular full body skin exams Recommend daily broad spectrum sunscreen SPF 30+ to sun-exposed areas, reapply every 2 hours as needed.  Call if any new or changing lesions are noted between office visits  Neoplasm of uncertain behavior right mid to inferior calf  Epidermal / dermal shaving  Lesion diameter (  cm):  0.5 Informed consent: discussed and consent obtained   Patient was prepped and draped in usual sterile fashion: Area prepped with alcohol. Anesthesia: the lesion was anesthetized in a standard fashion   Anesthetic:  1% lidocaine w/ epinephrine 1-100,000 buffered w/ 8.4% NaHCO3 Instrument used: flexible razor blade    Hemostasis achieved with: pressure, aluminum chloride and electrodesiccation   Outcome: patient tolerated procedure well   Post-procedure details: wound care instructions given   Post-procedure details comment:  Ointment and small bandage applied.   Specimen 1 - Surgical pathology Differential Diagnosis: Irregular nevus r/o dysplasia   Check Margins: No  Irregular nevus r/o dysplasia   Landmark 19 cm inferior to popliteal  About 24 cm superior to bottom of the heel   Viral warts, unspecified type (2) Left Thigh - Posterior; Right Thigh - Posterior  Viral Wart (HPV) Counseling  Discussed viral / HPV (Human Papilloma Virus) etiology and risk of spread /infectivity to other areas of body as well as to other people.  Multiple treatments and methods may be required to clear warts and it is possible treatment may not be successful.  Treatment risks include discoloration; scarring and there is still potential for wart recurrence.  Will send in wart pen to treat flat warts at leg  [facility] pharmacy information given to patient Pending rx being submitted  Varogen and aldera are other options    Related Medications fluorouracil (EFUDEX) 5 % cream Apply topically in the morning and at bedtime.  cimetidine (TAGAMET) 800 MG tablet TAKE 1 TABLET BY MOUTH 2 TIMES DAILY.  Inflamed seborrheic keratosis right popliteal x3   Isk vs wart   Symptomatic, irritating, patient would like treated.  Destruction of lesion - right popliteal x3 Complexity: simple   Destruction method: cryotherapy   Informed consent: discussed and consent obtained   Timeout:  patient name, date of birth, surgical site, and procedure verified Lesion destroyed using liquid nitrogen: Yes   Region frozen until ice ball extended beyond lesion: Yes   Outcome: patient tolerated procedure well with no complications   Post-procedure details: wound care instructions given     Return in about 1 year (around 12/17/2023)  for TBSE.  IAsher Muir, CMA, am acting as scribe for Armida Sans, MD.  Documentation: I have reviewed the above documentation for accuracy and completeness, and I agree with the above.  Armida Sans, MD

## 2022-12-21 MED ORDER — SAFETY SEAL MISCELLANEOUS MISC: 1.0000 "application " | Freq: Every evening | 1 refills | Status: AC

## 2022-12-24 ENCOUNTER — Telehealth: Payer: Self-pay

## 2022-12-24 MED ORDER — MUPIROCIN 2 % EX OINT: TOPICAL_OINTMENT | CUTANEOUS | 2 refills | Status: DC

## 2022-12-24 NOTE — Telephone Encounter (Signed)
Mupirocin ointment sent to [facility] [address]

## 2022-12-24 NOTE — Telephone Encounter (Signed)
Called pt scheduled surgery appt

## 2022-12-27 ENCOUNTER — Encounter: Payer: Self-pay | Admitting: Dermatology

## 2023-01-05 ENCOUNTER — Encounter: Payer: Self-pay | Admitting: Dermatology

## 2023-01-05 ENCOUNTER — Ambulatory Visit: Payer: 59 | Admitting: Dermatology

## 2023-01-05 VITALS — BP 131/87

## 2023-01-05 DIAGNOSIS — C4371 Malignant melanoma of right lower limb, including hip: Secondary | ICD-10-CM

## 2023-01-05 DIAGNOSIS — Z7189 Other specified counseling: Secondary | ICD-10-CM | POA: Diagnosis not present

## 2023-01-05 DIAGNOSIS — C439 Malignant melanoma of skin, unspecified: Secondary | ICD-10-CM

## 2023-01-05 NOTE — Patient Instructions (Addendum)
 Wound Care Instructions  On the day following your surgery, you should begin doing daily dressing changes: Remove the old dressing and discard it. Cleanse the wound gently with tap water. This may be done in the shower or by placing a wet gauze pad directly on the wound and letting it soak for several minutes. It is important to gently remove any dried blood from the wound in order to encourage healing. This may be done by gently rolling a moistened Q-tip on the dried blood. Do not pick at the wound. If the wound should start to bleed, continue cleaning the wound, then place a moist gauze pad on the wound and hold pressure for a few minutes.  Make sure you then dry the skin surrounding the wound completely or the tape will not stick to the skin. Do not use cotton balls on the wound. After the wound is clean and dry, apply the ointment gently with a Q-tip. Cut a non-stick pad to fit the size of the wound. Lay the pad flush to the wound. If the wound is draining, you may want to reinforce it with a small amount of gauze on top of the non-stick pad for a little added compression to the area. Use the tape to seal the area completely. Select from the following with respect to your individual situation: If your wound has been stitched closed: continue the above steps 1-8 at least daily until your sutures are removed. If your wound has been left open to heal: continue steps 1-8 at least daily for the first 3-4 weeks. We would like for you to take a few extra precautions for at least the next week. Sleep with your head elevated on pillows if our wound is on your head. Do not bend over or lift heavy items to reduce the chance of elevated blood pressure to the wound Do not participate in particularly strenuous activities.   Below is a list of dressing supplies you might need.  Cotton-tipped applicators - Q-tips Gauze pads (2x2 and/or 4x4) - All-Purpose Sponges Non-stick dressing material - Telfa Tape -  Paper or Hypafix New and clean tube of petroleum jelly - Vaseline    Comments on Post-Operative Period Slight swelling and redness often appear around the wound. This is normal and will disappear within several days following the surgery. The healing wound will drain a brownish-red-yellow discharge during healing. This is a normal phase of wound healing. As the wound begins to heal, the drainage may increase in amount. Again, this drainage is normal. Notify us if the drainage becomes persistently bloody, excessively swollen, or intensely painful or develops a foul odor or red streaks.  If you should experience mild discomfort during the healing phase, you may take an aspirin-free medication such as Tylenol (acetaminophen). Notify us if the discomfort is severe or persistent. Avoid alcoholic beverages when taking pain medicine.  In Case of Wound Hemorrhage A wound hemorrhage is when the bandage suddenly becomes soaked with bright red blood and flows profusely. If this happens, sit down or lie down with your head elevated. If the wound has a dressing on it, do not remove the dressing. Apply pressure to the existing gauze. If the wound is not covered, use a gauze pad to apply pressure and continue applying the pressure for 20 minutes without peeking. DO NOT COVER THE WOUND WITH A LARGE TOWEL OR WASH CLOTH. Release your hand from the wound site but do not remove the dressing. If the bleeding has stopped,  gently clean around the wound. Leave the dressing in place for 24 hours if possible. This wait time allows the blood vessels to close off so that you do not spark a new round of bleeding by disrupting the newly clotted blood vessels with an immediate dressing change. If the bleeding does not subside, continue to hold pressure. If matters are out of your control, contact an After Hours clinic or go to the Emergency Room.    Due to recent changes in healthcare laws, you may see results of your pathology  and/or laboratory studies on MyChart before the doctors have had a chance to review them. We understand that in some cases there may be results that are confusing or concerning to you. Please understand that not all results are received at the same time and often the doctors may need to interpret multiple results in order to provide you with the best plan of care or course of treatment. Therefore, we ask that you please give [place] 2 business days to thoroughly review all your results before contacting the office for clarification. Should we see a critical lab result, you will be contacted sooner.   If You Need Anything After Your Visit  If you have any questions or concerns for your doctor, please call our main line at [phone] and press option 4 to reach your doctor's medical assistant. If no one answers, please leave a voicemail as directed and we will return your call as soon as possible. Messages left after 4 pm will be answered the following business day.   You may also send [place] a message via MyChart. We typically respond to MyChart messages within 1-2 business days.  For prescription refills, please ask your pharmacy to contact our office. Our fax number is [phone].  If you have an urgent issue when the clinic is closed that cannot wait until the next business day, you can page your doctor at the number below.    Please note that while we do our best to be available for urgent issues outside of office hours, we are not available 24/7.   If you have an urgent issue and are unable to reach [place], you may choose to seek medical care at your doctor's office, retail clinic, urgent care center, or emergency room.  If you have a medical emergency, please immediately call 911 or go to the emergency department.  Pager Numbers  - Dr. Gwen Pounds: [phone]  - Dr. Roseanne Reno: [phone]  - Dr. Katrinka Blazing: [phone]   In the event of inclement weather, please call our main line at [phone]  for an update on the status of any delays or closures.  Dermatology Medication Tips: Please keep the boxes that topical medications come in in order to help keep track of the instructions about where and how to use these. Pharmacies typically print the medication instructions only on the boxes and not directly on the medication tubes.   If your medication is too expensive, please contact our office at [phone] option 4 or send [place] a message through MyChart.   We are unable to tell what your co-pay for medications will be in advance as this is different depending on your insurance coverage. However, we may be able to find a substitute medication at lower cost or fill out paperwork to get insurance to cover a needed medication.   If a prior authorization is required to get your medication covered by your insurance company, please allow [place] 1-2 business days to complete this  process.  Drug prices often vary depending on where the prescription is filled and some pharmacies may offer cheaper prices.  The website www.goodrx.com contains coupons for medications through different pharmacies. The prices here do not account for what the cost may be with help from insurance (it may be cheaper with your insurance), but the website can give you the price if you did not use any insurance.  - You can print the associated coupon and take it with your prescription to the pharmacy.  - You may also stop by our office during regular business hours and pick up a GoodRx coupon card.  - If you need your prescription sent electronically to a different pharmacy, notify our office through [facility] or by phone at [phone] option 4.     Si Usted Necesita Algo Despus de Su Visita  Tambin puede enviarnos un mensaje a travs de Clinical cytogeneticist. Por lo general respondemos a los mensajes de MyChart en el transcurso de 1 a 2 das hbiles.  Para renovar recetas, por favor pida a su farmacia que se ponga en contacto  con nuestra oficina. [name] de fax es [place] [phone].  Si tiene un asunto urgente cuando la clnica est cerrada y que no puede esperar hasta el siguiente da hbil, puede llamar/localizar a su doctor(a) al nmero que aparece a continuacin.   Por favor, tenga en cuenta que aunque hacemos todo lo posible para estar disponibles para asuntos urgentes fuera del horario de [place], no estamos disponibles las 24 horas del da, los 7 [address]  [address] de la [place].   Si tiene un problema urgente y no puede comunicarse con nosotros, puede optar por buscar atencin mdica  en el consultorio de su doctor(a), en una clnica privada, en un centro de atencin urgente o en una sala de emergencias.  Si tiene Engineer, drilling, por favor llame inmediatamente al 911 o vaya a la sala de emergencias.  Nmeros de bper  - Dr. Gwen Pounds: [phone]  - Dra. Roseanne Reno: [phone]  - Dr. Katrinka Blazing: [phone]   En caso de inclemencias del tiempo, por favor llame a Lacy Duverney principal al [phone] para una actualizacin sobre el [place] de cualquier retraso o cierre.  Consejos para la medicacin en dermatologa: Por favor, guarde las cajas en las que vienen los medicamentos de uso tpico para ayudarle a seguir las instrucciones sobre dnde y cmo usarlos. Las farmacias generalmente imprimen las instrucciones del medicamento slo en las cajas y no directamente en los tubos del [place].   Si su medicamento es muy caro, por favor, pngase en contacto con Rolm Gala llamando al [phone] y presione la opcin 4 o envenos un mensaje a travs de Clinical cytogeneticist.   No podemos decirle cul ser su copago por los medicamentos por adelantado ya que esto es diferente dependiendo de la cobertura de su seguro. Sin embargo, es posible que podamos encontrar un medicamento sustituto a Audiological scientist un formulario para que el seguro cubra el medicamento que se considera necesario.   Si se requiere una autorizacin  previa para que su compaa de seguros [place] su medicamento, por favor permtanos de 1 a 2 das hbiles para completar [address]  Los precios de los medicamentos varan con frecuencia dependiendo del Environmental consultant de dnde se surte la receta y alguna farmacias pueden ofrecer precios ms baratos.  El sitio web www.goodrx.com tiene cupones para medicamentos de Health and safety inspector. Los precios aqu no tienen en cuenta lo que podra costar con la ayuda del seguro (  puede ser ms barato con su seguro), pero el sitio web puede darle el precio si no Visual merchandiser.  - Puede imprimir el cupn correspondiente y llevarlo con su receta a la farmacia.  - Tambin puede pasar por nuestra oficina durante el horario de atencin regular y Education officer, museum una tarjeta de cupones de GoodRx.  - Si necesita que su receta se enve electrnicamente a una farmacia diferente, informe a nuestra oficina a travs de MyChart de [place] o por telfono llamando al [phone] y presione la opcin 4.

## 2023-01-05 NOTE — Progress Notes (Signed)
Follow-Up Visit   Subjective  Regina [name] is a 42 y.o. 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  The following portions of the chart were reviewed this encounter and updated as appropriate: medications, allergies, medical history  Review of Systems:  No other skin or systemic complaints except as noted in HPI or Assessment and Plan.  Objective  Well appearing patient in no apparent distress; mood and affect are within normal limits.   A focused examination was performed of the following areas: Right leg  Relevant exam findings are noted in the Assessment and Plan.  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  Skin excision  Lesion length (cm):  1.9 Lesion width (cm):  1.9 Total excision diameter (cm):  3.1 Informed consent: discussed and consent obtained   Timeout: patient name, date of birth, surgical site, and procedure verified   Procedure prep:  Patient was prepped and draped in usual sterile fashion Prep type:  Isopropyl alcohol and povidone-iodine Anesthesia: the lesion was anesthetized in a standard fashion   Anesthetic:  1% lidocaine w/ epinephrine 1-100,000 buffered w/ 8.4% NaHCO3 (12cc lido w/ epi, 3cc bupivicaine, Total of 15cc) Instrument used: #15 blade   Hemostasis achieved with: pressure   Hemostasis achieved with comment:  Electrocautery Outcome: patient tolerated procedure well with no complications   Post-procedure details: sterile dressing applied and wound care instructions given   Dressing type: bandage, pressure dressing and bacitracin (Mupirocin)    Skin repair Complexity:  Intermediate Final length (cm):  3.1 Informed consent: discussed and consent obtained    Timeout: patient name, date of birth, surgical site, and procedure verified   Procedure prep:  Patient was prepped and draped in usual sterile fashion Prep type:  Povidone-iodine Anesthesia: the lesion was anesthetized in a standard fashion   Anesthetic:  1% lidocaine w/ epinephrine 1-100,000 buffered w/ 8.4% NaHCO3 Reason for type of repair: reduce tension to allow closure, reduce the risk of dehiscence, infection, and necrosis, reduce subcutaneous dead space and avoid a hematoma, allow closure of the large defect, preserve normal anatomy, preserve normal anatomical and functional relationships and enhance both functionality and cosmetic results   Undermining: edges undermined   Undermining comment:  Undermining defect 1.0cm Subcutaneous layers (deep stitches):  Subcutaneous suture technique: Inverted Dermal. Fine/surface layer approximation (top stitches):  Suture size:  2-0 Suture type: Vicryl (polyglactin 910)   Stitches comment:  Purse string closure Hemostasis achieved with: pressure Hemostasis achieved with comment:  Electrocautery Outcome: patient tolerated procedure well with no complications   Post-procedure details: sterile dressing applied and wound care instructions given   Dressing type: bandage and pressure dressing (PuraPly XT (sample x 1, Lot [id number] exp09/02/2025,  put in wound followed by Mupirocin)    Specimen 1 - Surgical pathology Differential Diagnosis: Bx proven Melanoma  Check Margins: yes Pink bx site 1.9cm [id number] Tag at 12 o'clock superior edge  Bx proven, Breslow's 0.49mm, Clarks level II, excised today Merri Brunette Testing results pending No lymphadenopathy Start Mupirocin oint qd to excision site (recently sent in 12/24/22)   COUNSELING: Melanoma Matrix Counseling and Coordination of Care  Discussed diagnosis in detail including significance of melanoma diagnosis  which can be potentially lethal.  Discussed treatment recommendations in detail  advising that treatment recommendations are based on longitudinal studies and retrospective studies and are nationwide protocols.  Advised there is always potential for melanoma recurrence even after definitive treatment.  After definitive treatment, we recommend Skin Cancer Screening Exams (with total-body skin exams) every 3 months for a year; then every 4 months for a year; then every 6 months for 3 years.  At 5 years post treatment, if all appears well,  we would recommend at least yearly Skin Cancer Screenings (with total-body skin exams) for the rest of your life.  The patient was given time for questions and these were answered.  We recommend frequent self skin examinations; photoprotection with sunscreen, sun protective clothing, hats, sunglasses and sun avoidance.  If the patient notices any new or changing skin lesions the patient should return to the office immediately for evaluation.     Return in about 8 days (around 01/13/2023) for suture removal, 50m for TBSE , Hx of Melanoma, Hx of Dysplastic nevi.  I, [name], RMA, am acting as scribe for Armida Sans, MD .   Documentation: I have reviewed the above documentation for accuracy and completeness, and I agree with the above.  Armida Sans, MD

## 2023-01-06 ENCOUNTER — Telehealth: Payer: Self-pay

## 2023-01-06 NOTE — Telephone Encounter (Signed)
Left pt msg to call if any problems after yesterday's surgery./sh 

## 2023-01-07 ENCOUNTER — Telehealth: Payer: Self-pay

## 2023-01-07 NOTE — Telephone Encounter (Signed)
Regina [name] results testing scanned in under the media tab for your review.

## 2023-01-08 ENCOUNTER — Encounter: Payer: Self-pay | Admitting: Dermatology

## 2023-01-12 ENCOUNTER — Telehealth: Payer: Self-pay

## 2023-01-12 NOTE — Telephone Encounter (Signed)
Patient informed of pathology results and [place] test results.

## 2023-01-12 NOTE — Telephone Encounter (Signed)
-----   Message from Armida Sans sent at 01/07/2023  6:04 PM EDT ----- Diagnosis Skin (M), right mid to inferior calf NO RESIDUAL MELANOMA, MARGINS FREE  Site of invasive Melanoma Margins clear

## 2023-01-14 ENCOUNTER — Ambulatory Visit (INDEPENDENT_AMBULATORY_CARE_PROVIDER_SITE_OTHER): Payer: 59 | Admitting: Dermatology

## 2023-01-14 DIAGNOSIS — Z85820 Personal history of malignant melanoma of skin: Secondary | ICD-10-CM

## 2023-01-14 NOTE — Progress Notes (Signed)
   Follow-Up Visit   Subjective  Regina [name] is a 42 y.o. female who presents for the following: patient here today for wound check and bandage change at . She reports still sore and tender at area. Some drainage.   The patient has spots, moles and lesions to be evaluated, some may be new or changing and the patient may have concern these could be cancer.   The following portions of the chart were reviewed this encounter and updated as appropriate: medications, allergies, medical history  Review of Systems:  No other skin or systemic complaints except as noted in HPI or Assessment and Plan.  Objective  Well appearing patient in no apparent distress; mood and affect are within normal limits.    A focused examination was performed of the following areas: Right mid to inferior calf   Relevant exam findings are noted in the Assessment and Plan.    Assessment & Plan    Dressing change for wound Exam: healing surgical wound.    Treatment Plan: No infection.   Cleaned wound with Puracyn Applied PuraPly XT antimicrobial wound matrix, mupirocin ointment was then applied, covered with non stick gauze and wrapped with coban.   Will recheck in 2 weeks.     ACTINIC DAMAGE - chronic, secondary to cumulative UV radiation exposure/sun exposure over time - diffuse scaly erythematous macules with underlying dyspigmentation - Recommend daily broad spectrum sunscreen SPF 30+ to sun-exposed areas, reapply every 2 hours as needed.  - Recommend staying in the shade or wearing long sleeves, sun glasses (UVA+UVB protection) and wide brim hats (4-inch brim around the entire circumference of the hat). - Call for new or changing lesions.    Return for 2 week bandage change .  IAsher Muir, CMA, am acting as scribe for Armida Sans, MD.   Documentation: I have reviewed the above documentation for accuracy and completeness, and I agree with the above.  Armida Sans, MD

## 2023-01-14 NOTE — Patient Instructions (Signed)

## 2023-01-16 ENCOUNTER — Encounter: Payer: Self-pay | Admitting: Dermatology

## 2023-01-20 ENCOUNTER — Telehealth: Payer: Self-pay

## 2023-01-20 NOTE — Telephone Encounter (Signed)
Patient came by the office stating the PuraPly tissue came off two days after application. Patient is not scheduled to come back until September 19th.

## 2023-01-28 ENCOUNTER — Ambulatory Visit: Payer: 59 | Admitting: Dermatology

## 2023-02-02 ENCOUNTER — Ambulatory Visit (INDEPENDENT_AMBULATORY_CARE_PROVIDER_SITE_OTHER): Payer: 59 | Admitting: Dermatology

## 2023-02-02 VITALS — BP 141/95

## 2023-02-02 DIAGNOSIS — Z85820 Personal history of malignant melanoma of skin: Secondary | ICD-10-CM

## 2023-02-02 NOTE — Patient Instructions (Signed)

## 2023-02-02 NOTE — Progress Notes (Unsigned)
   Follow-Up Visit   Subjective  Regina [name] is a 42 y.o. female who presents for the following: recheck Melanoma, R mid to inf calf, excised 01/05/23, 2 wk f/u The patient has spots, moles and lesions to be evaluated, some may be new or changing and the patient may have concern these could be cancer.   The following portions of the chart were reviewed this encounter and updated as appropriate: medications, allergies, medical history  Review of Systems:  No other skin or systemic complaints except as noted in HPI or Assessment and Plan.  Objective  Well appearing patient in no apparent distress; mood and affect are within normal limits.   A focused examination was performed of the following areas: Right calf  Relevant exam findings are noted in the Assessment and Plan.    Assessment & Plan   MELANOMA margins free, bx proven 1 month s/p excision R mid to inferior calf Exam: healing excision site  Treatment Plan: Healing excision site Wound cleansed with Puracyn, PuraPly-XT applied in wound, followed by Mupirocin oint, telfa and coban Cont wound care with Mupirocin oint qd   Return for as scheduled for 72m TBSE, Hx of Melanoma, Hx of Dysplastic nevi.  I, [name], RMA, am acting as scribe for Armida Sans, MD .   Documentation: I have reviewed the above documentation for accuracy and completeness, and I agree with the above.  Armida Sans, MD

## 2023-02-03 ENCOUNTER — Encounter: Payer: Self-pay | Admitting: Dermatology

## 2023-02-17 ENCOUNTER — Other Ambulatory Visit: Payer: Self-pay | Admitting: *Deleted

## 2023-02-17 ENCOUNTER — Encounter: Payer: Self-pay | Admitting: Urology

## 2023-02-17 ENCOUNTER — Ambulatory Visit: Payer: 59 | Admitting: Urology

## 2023-02-17 ENCOUNTER — Other Ambulatory Visit
Admission: RE | Admit: 2023-02-17 | Discharge: 2023-02-17 | Disposition: A | Discharge status: Home / Self Care | Payer: 59 | Attending: Urology | Admitting: Urology

## 2023-02-17 VITALS — BP 135/92 | HR 90 | Ht 67.0 in | Wt 135.2 lb

## 2023-02-17 DIAGNOSIS — N390 Urinary tract infection, site not specified: Secondary | ICD-10-CM

## 2023-02-17 DIAGNOSIS — Z87440 Personal history of urinary (tract) infections: Secondary | ICD-10-CM | POA: Diagnosis not present

## 2023-02-17 DIAGNOSIS — R3915 Urgency of urination: Secondary | ICD-10-CM

## 2023-02-17 DIAGNOSIS — N958 Other specified menopausal and perimenopausal disorders: Secondary | ICD-10-CM

## 2023-02-17 LAB — URINALYSIS, COMPLETE (UACMP) WITH MICROSCOPIC
Bilirubin Urine: NEGATIVE
Glucose, UA: NEGATIVE mg/dL
Ketones, ur: NEGATIVE mg/dL
Leukocytes,Ua: NEGATIVE
Nitrite: NEGATIVE
Protein, ur: 30 mg/dL — AB
Specific Gravity, Urine: 1.015 (ref 1.005–1.030)
pH: 6 (ref 5.0–8.0)

## 2023-02-17 LAB — BLADDER SCAN AMB NON-IMAGING

## 2023-02-17 MED ORDER — ESTRADIOL 0.1 MG/GM VA CREA: TOPICAL_CREAM | VAGINAL | 12 refills | Status: AC

## 2023-02-17 NOTE — Progress Notes (Signed)
02/17/23 4:10 PM   Regina [name] Sep 13, 1980 [id number]  CC: Recurrent UTI, urinary symptoms  HPI: 42 year old female who reports 6 months of worsening symptoms, primarily urinary urgency.  She also reports she has had recurrent UTIs, but I do not appreciate any prior positive cultures in our system.  She is perimenopausal.  She drinks only water during the day.  She denies any dysuria or gross hematuria.  She has a history of colorectal cancer treated with transanal resection in 2020 at [facility].  PVR today normal at 85ml  Urinalysis today 6-10 squamous cells, 6-10 WBC, few bacteria, WBC clumps present, leukocytes negative, nitrite negative.  Most recent imaging with CT stone protocol in October 2023 was benign, no evidence of stones or hydronephrosis.   PMH: Past Medical History:  Diagnosis Date   Cervical cancer (HCC) 2012   cervical cancer   Chronic idiopathic constipation    Depression    Dysplastic nevus 09/16/2017   Right medial mid scapula. Moderate to severe atypia and halo nevus effect. Deep margin involved. Excised 12/07/2017, margins free.    Dysplastic nevus 09/16/2017   Upper back spinal. Moderate atypia, deep margin involved    Dysplastic nevus 09/16/2017   Lat. mid back infrascapular. Mild atypia, deep margin involved.    Dysplastic nevus 09/19/2018   Left low back 8cm lat. to spine. Moderate to severe atypia, inflamed, peripheral margin involved.  Excised 10/18/2018, margins free.   Dysplastic nevus 09/19/2018   Right mid back paraspinal above braline. Moderate atypia, deep margin involved.   Dysplastic nevus 09/19/2018   Upper back spinal. Moderate atypia, peripheral margin involved.    Dysplastic nevus 09/19/2018   Post. base of neck. Moderate atypia, close to margin.   Dysplastic nevus 09/19/2018   Left side below braline. Moderate atypia, irritated, limited margins free.    Dysplastic nevus 10/25/2018   Left low back below excision site. Severe atypia, close  to margin.   Headache    migraines   Hx of dysplastic nevus 12/19/2013   Left periumbilical. Mild atypia deep margin involved.   Kidney stone    Melanoma (HCC) 12/17/2022   right mid to inferior calf, excised 01/05/23   Skin cancer    Tubal pregnancy     Surgical History: Past Surgical History:  Procedure Laterality Date   AUGMENTATION MAMMAPLASTY Bilateral 2015   COLONOSCOPY WITH PROPOFOL N/A 07/22/2018   Procedure: COLONOSCOPY WITH PROPOFOL;  Surgeon: Pasty Spillers, MD;  Location: [facility] ENDOSCOPY;  Service: Endoscopy;  Laterality: N/A;   SKIN CANCER EXCISION     TUBAL LIGATION     tumor removed from mouth     non cancerous    Family History: Family History  Problem Relation Age of Onset   Diabetes Mother    Hypertension Mother    Cancer Father        colon   Diabetes Father    Hypertension Father    Migraines Father    Diabetes Maternal Grandfather     Social History:  reports that she has never smoked. She has never used smokeless tobacco. She reports current alcohol use. She reports that she does not use drugs.  Physical Exam: BP (!) 135/92 (BP Location: Left Arm, Patient Position: Sitting, Cuff Size: Normal)   Pulse 90   Ht 5\' 7"  (1.702 m)   Wt 135 lb 3.2 oz (61.3 kg)   BMI 21.18 kg/m    Constitutional:  Alert and oriented, No acute distress. Cardiovascular: No clubbing, cyanosis,  or edema. Respiratory: Normal respiratory effort, no increased work of breathing. GI: Abdomen is soft, nontender, nondistended, no abdominal masses   Laboratory Data: Reviewed, see HPI  Pertinent Imaging: I have personally viewed and interpreted the CT from October 2023 showing no evidence of stones or hydronephrosis.  Assessment & Plan:   42 year old perimenopausal female who reports about 6 months of urinary urgency and recurrent UTI.  Possible etiologies include genitourinary syndrome of menopause, pelvic floor dysfunction, overactive bladder, nephrolithiasis.  I  recommended starting with a trial of topical estrogen cream with close follow-up in 4 to 6 weeks for symptom check.  If persistent symptoms at that time could consider an OAB medication.  Follow-up urine culture, consider antibiotics if positive Trial of topical estrogen cream for possible GSM Close follow-up in 1 month symptom check, consider pelvic floor physical therapy or OAB medication in the future  Legrand Rams, MD 02/17/2023  [facility] [address] [place], [place] [address] [phone]

## 2023-02-17 NOTE — Patient Instructions (Signed)
Your symptoms could be caused by a number of different issues.  Patients with prior pelvic surgery can have problems with urination and emptying, as well as pelvic floor dysfunction.  Genitourinary syndrome of menopause would be the most likely factor and can cause recurrent infections as well as urinary symptoms like urgency, frequency, and pelvic discomfort.  Other possibilities include kidney stones or infection.  We will start with topical estrogen cream which can reduce the risk of infections and help with urinary urgency and frequency, but we will see you back in a month to make sure things are improving.  If not we can try other strategies to improve your symptoms

## 2023-03-23 ENCOUNTER — Ambulatory Visit: Payer: 59 | Admitting: Urology

## 2023-03-30 ENCOUNTER — Ambulatory Visit: Payer: 59 | Admitting: Urology

## 2023-04-21 ENCOUNTER — Ambulatory Visit: Payer: 59 | Admitting: Dermatology

## 2023-04-28 ENCOUNTER — Encounter: Payer: Self-pay | Admitting: Dermatology

## 2023-04-28 ENCOUNTER — Ambulatory Visit: Payer: 59 | Admitting: Dermatology

## 2023-04-28 DIAGNOSIS — D492 Neoplasm of unspecified behavior of bone, soft tissue, and skin: Secondary | ICD-10-CM

## 2023-04-28 DIAGNOSIS — D0372 Melanoma in situ of left lower limb, including hip: Secondary | ICD-10-CM

## 2023-04-28 DIAGNOSIS — D224 Melanocytic nevi of scalp and neck: Secondary | ICD-10-CM | POA: Diagnosis not present

## 2023-04-28 DIAGNOSIS — Z1283 Encounter for screening for malignant neoplasm of skin: Secondary | ICD-10-CM | POA: Diagnosis not present

## 2023-04-28 DIAGNOSIS — D0362 Melanoma in situ of left upper limb, including shoulder: Secondary | ICD-10-CM | POA: Diagnosis not present

## 2023-04-28 DIAGNOSIS — D229 Melanocytic nevi, unspecified: Secondary | ICD-10-CM

## 2023-04-28 DIAGNOSIS — Z86018 Personal history of other benign neoplasm: Secondary | ICD-10-CM

## 2023-04-28 DIAGNOSIS — L578 Other skin changes due to chronic exposure to nonionizing radiation: Secondary | ICD-10-CM

## 2023-04-28 DIAGNOSIS — L905 Scar conditions and fibrosis of skin: Secondary | ICD-10-CM

## 2023-04-28 DIAGNOSIS — L814 Other melanin hyperpigmentation: Secondary | ICD-10-CM | POA: Diagnosis not present

## 2023-04-28 DIAGNOSIS — L821 Other seborrheic keratosis: Secondary | ICD-10-CM

## 2023-04-28 DIAGNOSIS — W908XXA Exposure to other nonionizing radiation, initial encounter: Secondary | ICD-10-CM | POA: Diagnosis not present

## 2023-04-28 DIAGNOSIS — D039 Melanoma in situ, unspecified: Secondary | ICD-10-CM

## 2023-04-28 DIAGNOSIS — D2239 Melanocytic nevi of other parts of face: Secondary | ICD-10-CM | POA: Diagnosis not present

## 2023-04-28 DIAGNOSIS — Z85820 Personal history of malignant melanoma of skin: Secondary | ICD-10-CM | POA: Diagnosis not present

## 2023-04-28 HISTORY — DX: Melanoma in situ, unspecified: D03.9

## 2023-04-28 MED ORDER — MUPIROCIN 2 % EX OINT: TOPICAL_OINTMENT | CUTANEOUS | 6 refills | Status: DC

## 2023-04-28 NOTE — Progress Notes (Signed)
Follow-Up Visit   Subjective  Regina [name] is a 42 y.o. female who presents for the following: Skin Cancer Screening and Full Body Skin Exam, hx of Melanoma, hx of Dysplastic Nevi, check mole on L lower leg, pt thinks may be darker  The patient presents for Total-Body Skin Exam (TBSE) for skin cancer screening and mole check. The patient has spots, moles and lesions to be evaluated, some may be new or changing and the patient may have concern these could be cancer.    The following portions of the chart were reviewed this encounter and updated as appropriate: medications, allergies, medical history  Review of Systems:  No other skin or systemic complaints except as noted in HPI or Assessment and Plan.  Objective  Well appearing patient in no apparent distress; mood and affect are within normal limits.  A full examination was performed including scalp, head, eyes, ears, nose, lips, neck, chest, axillae, abdomen, back, buttocks, bilateral upper extremities, bilateral lower extremities, hands, feet, fingers, toes, fingernails, and toenails. All findings within normal limits unless otherwise noted below.   Relevant physical exam findings are noted in the Assessment and Plan.  L pretibial Irregular brown macule 0.6cm  L ant neck base Irregular brown macule 0.6cm  R proximal mandible 1.1 x 0.6cm tan macule   R post auricular   Assessment & Plan   SKIN CANCER SCREENING PERFORMED TODAY.  ACTINIC DAMAGE - Chronic condition, secondary to cumulative UV/sun exposure - diffuse scaly erythematous macules with underlying dyspigmentation - Recommend daily broad spectrum sunscreen SPF 30+ to sun-exposed areas, reapply every 2 hours as needed.  - Staying in the shade or wearing long sleeves, sun glasses (UVA+UVB protection) and wide brim hats (4-inch brim around the entire circumference of the hat) are also recommended for sun protection.  - Call for new or changing  lesions.  LENTIGINES, SEBORRHEIC KERATOSES, HEMANGIOMAS - Benign normal skin lesions - Benign-appearing - Call for any changes  MELANOCYTIC NEVI - Tan-brown and/or pink-flesh-colored symmetric macules and papules - Benign appearing on exam today - Observation - Call clinic for new or changing moles - Recommend daily use of broad spectrum spf 30+ sunscreen to sun-exposed areas.  - R post auricular 0.4 x 0.3cm brown macule - L proximal mandible 0.4 x 0.3cm brown flat regular pap  HISTORY OF MELANOMA - No evidence of recurrence today - No lymphadenopathy - Recommend regular full body skin exams - Recommend daily broad spectrum sunscreen SPF 30+ to sun-exposed areas, reapply every 2 hours as needed.  - Call if any new or changing lesions are noted between office visits  -R mid to inferior calf, Breslow's 0.7 MM CLARK/ANATOMIC LEVEL: II excised 01/05/23  HISTORY OF DYSPLASTIC NEVUS No evidence of recurrence today Recommend regular full body skin exams Recommend daily broad spectrum sunscreen SPF 30+ to sun-exposed areas, reapply every 2 hours as needed.  Call if any new or changing lesions are noted between office visits  - Multiple NEOPLASM OF SKIN (3) L pretibial Epidermal / dermal shaving  Lesion diameter (cm):  0.6 Informed consent: discussed and consent obtained   Timeout: patient name, date of birth, surgical site, and procedure verified   Procedure prep:  Patient was prepped and draped in usual sterile fashion Prep type:  Isopropyl alcohol Anesthesia: the lesion was anesthetized in a standard fashion   Anesthetic:  1% lidocaine w/ epinephrine 1-100,000 buffered w/ 8.4% NaHCO3 Instrument used: flexible razor blade   Hemostasis achieved with: pressure, aluminum chloride  and electrodesiccation   Outcome: patient tolerated procedure well   Post-procedure details: sterile dressing applied and wound care instructions given   Dressing type: bandage and bacitracin   Specimen 1  - Surgical pathology Differential Diagnosis: D48.5 Nevus vs Dysplastic Nevus  Check Margins: yes Irregular brown macule 0.6cm L ant neck base Epidermal / dermal shaving  Lesion diameter (cm):  0.6 Informed consent: discussed and consent obtained   Timeout: patient name, date of birth, surgical site, and procedure verified   Procedure prep:  Patient was prepped and draped in usual sterile fashion Prep type:  Isopropyl alcohol Anesthesia: the lesion was anesthetized in a standard fashion   Anesthetic:  1% lidocaine w/ epinephrine 1-100,000 buffered w/ 8.4% NaHCO3 Instrument used: flexible razor blade   Hemostasis achieved with: pressure, aluminum chloride and electrodesiccation   Outcome: patient tolerated procedure well   Post-procedure details: sterile dressing applied and wound care instructions given   Dressing type: bandage and bacitracin   Specimen 2 - Surgical pathology Differential Diagnosis: D48.5 Nevus vs Dysplastic nevus  Check Margins: yes Irregular brown macule 0.6cm R proximal mandible Epidermal / dermal shaving  Lesion diameter (cm):  1.1 Informed consent: discussed and consent obtained   Timeout: patient name, date of birth, surgical site, and procedure verified   Procedure prep:  Patient was prepped and draped in usual sterile fashion Prep type:  Isopropyl alcohol Anesthesia: the lesion was anesthetized in a standard fashion   Anesthetic:  1% lidocaine w/ epinephrine 1-100,000 buffered w/ 8.4% NaHCO3 Instrument used: flexible razor blade   Hemostasis achieved with: pressure, aluminum chloride and electrodesiccation   Outcome: patient tolerated procedure well   Post-procedure details: sterile dressing applied and wound care instructions given   Dressing type: bandage and bacitracin   Specimen 3 - Surgical pathology Differential Diagnosis: D48.5 Nevus vs Dysplastic Nevus  Check Margins: yes 1.1 x 0.6cm tan macule Return in about 3 months (around 07/27/2023)  for TBSE, Hx of Melanoma, Hx of Dysplastic nevi.  I, Regina [name], RMA, am acting as scribe for Regina Sans, MD .   Documentation: I have reviewed the above documentation for accuracy and completeness, and I agree with the above.  Regina Sans, MD

## 2023-04-28 NOTE — Patient Instructions (Addendum)

## 2023-05-06 LAB — SURGICAL PATHOLOGY

## 2023-05-09 ENCOUNTER — Encounter: Payer: Self-pay | Admitting: Dermatology

## 2023-05-10 ENCOUNTER — Telehealth: Payer: Self-pay

## 2023-05-10 NOTE — Telephone Encounter (Signed)
Left patient a message that I scheduled her for surgery for the Melanoma IS on L pretibial for 05/31/22 at 10:30.  Advised pt to call and let us know if this appointment works for her./sh

## 2023-05-18 ENCOUNTER — Telehealth: Payer: Self-pay

## 2023-05-18 DIAGNOSIS — C439 Malignant melanoma of skin, unspecified: Secondary | ICD-10-CM

## 2023-05-18 NOTE — Telephone Encounter (Signed)
 Patient advised Dr. Hester needs to cancel surgery at this time & will be out of the office.  He has spoke with Dr. Paci and she is okay with referring patient to her for excision of Melanoma IS.   Patient advised of scheduling change and to call office if she has not heard from [facility] in 1 week.

## 2023-05-25 ENCOUNTER — Other Ambulatory Visit: Payer: Self-pay | Admitting: Dermatology

## 2023-05-25 ENCOUNTER — Encounter: Payer: Self-pay | Admitting: Dermatology

## 2023-05-25 ENCOUNTER — Ambulatory Visit (INDEPENDENT_AMBULATORY_CARE_PROVIDER_SITE_OTHER): Payer: 59 | Admitting: Dermatology

## 2023-05-25 VITALS — BP 137/90 | HR 86

## 2023-05-25 DIAGNOSIS — D0371 Melanoma in situ of right lower limb, including hip: Secondary | ICD-10-CM | POA: Diagnosis not present

## 2023-05-25 DIAGNOSIS — D0372 Melanoma in situ of left lower limb, including hip: Secondary | ICD-10-CM | POA: Diagnosis not present

## 2023-05-25 DIAGNOSIS — L988 Other specified disorders of the skin and subcutaneous tissue: Secondary | ICD-10-CM | POA: Diagnosis not present

## 2023-05-25 DIAGNOSIS — D485 Neoplasm of uncertain behavior of skin: Secondary | ICD-10-CM

## 2023-05-25 DIAGNOSIS — C439 Malignant melanoma of skin, unspecified: Secondary | ICD-10-CM

## 2023-05-25 DIAGNOSIS — D492 Neoplasm of unspecified behavior of bone, soft tissue, and skin: Secondary | ICD-10-CM | POA: Diagnosis not present

## 2023-05-25 DIAGNOSIS — L821 Other seborrheic keratosis: Secondary | ICD-10-CM | POA: Diagnosis not present

## 2023-05-25 NOTE — Progress Notes (Signed)
 Follow-Up Visit   Subjective  Regina [name] is a 43 y.o. female who presents for the following: discussion of treatment options and potential procedure for an MIS on the left pretibial region, biopsied by Dr. Hester.   She also has a lesion of concern on the left posterior calf, present for years, but getting darker.   Patient has a 0.7 mm MM on the right calf treated with WLE by Dr. Hester in 2024.  The following portions of the chart were reviewed this encounter and updated as appropriate: medications, allergies, medical history  Review of Systems:  No other skin or systemic complaints except as noted in HPI or Assessment and Plan.  Objective  Well appearing patient in no apparent distress; mood and affect are within normal limits.  A focused examination was performed of the following areas:  Left pretibial  Relevant physical exam findings are noted in the Assessment and Plan.      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 No   Was the anticoagulation regimen changed prior to Mohs? No    Procedure Details: Timeout: pre-procedure verification complete Procedure Prep: patient was prepped and draped in usual sterile fashion Prep type: chlorhexidine Biopsy accession number: [id number] Biopsy lab: [facility] path Date of biopsy: 04/28/2023 Frozen section biopsy performed: No   Specimen debulked: Yes   Pre-Op diagnosis: melanoma Melanoma subtype: in situ MohsAIQ Surgical site (if tumor spans multiple areas, please select predominant area): shin Surgery side: left Surgical site (from skin exam): Left pretibial Pre-operative length (cm): 1.2 Pre-operative width (cm): 0.8 Indications for Mohs surgery: anatomic location where tissue conservation is critical Previously  treated? No   Details of micrographic surgery: 5 mm margins  Micrographic Surgery Details:  Patient tolerance of procedure: tolerated well, no immediate complications  Reconstruction: Was the defect reconstructed?: No    Opioids: Did the patient receive a prescription for opioid/narcotic related to Mohs surgery? Yes   Indications for opioid/narcotics: patient required additional pain relief despite trial of non-opioid analgesia  Antibiotics: Does patient meet AHA guidelines for endocarditis?: No   Does patient meet AHA guidelines for orthopedic prophylaxis?: No   Were antibiotics given on the day of surgery?: No   Did surgery breach mucosa, expose cartilage/bone, involve an area of lymphedema/inflamed/infected tissue? No   Related Procedures Skin excision Skin excision Skin excision Skin excision Skin excision MELANOMA IN SITU OF LEFT LOWER EXTREMITY INCLUDING HIP (HCC)   Related Procedures Skin excision Skin excision Skin excision Skin excision Skin excision NEOPLASM OF UNCERTAIN BEHAVIOR OF SKIN Left Lower Leg - Posterior Skin / nail biopsy Type of biopsy: tangential   Informed consent: discussed and consent obtained   Timeout: patient name, date of birth, surgical site, and procedure verified   Procedure prep:  Patient was prepped and draped in usual sterile fashion Prep type:  Isopropyl alcohol Anesthesia: the lesion was anesthetized in a standard fashion   Anesthetic:  1% lidocaine  w/ epinephrine 1-100,000 buffered w/ 8.4% NaHCO3 Instrument used: DermaBlade   Hemostasis achieved with: aluminum chloride   Outcome: patient tolerated procedure well   Post-procedure details: sterile dressing applied and wound care instructions given   Dressing type: petrolatum and bandage    Discussed options for Treatment for MIS of the left pretibial region, including WLE and Mohs.  Slow Mohs Surgery:  Slow Mohs, also known  as staged excision with margin control, involves  gradual tissue removal and examination to ensure complete tumor excision while minimizing healthy tissue loss. This method may be considered in cases where precise margin control is crucial for facial or high-risk area lesions. Advantages: Offers margin control and less tissue removal, providing a balance between tumor removal and cosmetic outcomes. Disadvantages: Can be time-consuming and may require multiple visits.  Mohs Micrographic Surgery: Mohs is an ideal technique for melanomas located in cosmetically sensitive areas, or in cases where precise margin control is required. This method involves removing the tumor layer by layer, with immediate microscopic examination of each layer to ensure complete excision of malignant tissue. Advantages: Highest cure rates for certain types of melanoma, especially on the face or in areas with complex anatomy. It allows for maximum tissue conservation and accurate margin assessment. Disadvantages: May be less effective for larger lesions, deeper melanomas, or those with significant ulceration; requires outside referral as [facility] does not offer same day Immunohistochemical stains Mohs for Melanoma at this time  Wide Local Excision (WLE): Wide local excision involves the removal of the melanoma with a predetermined margin of healthy tissue around the lesion, followed by closure of the wound. This is often performed when Mohs is not indicated due to tumor size or location. Advantages: Well-established and effective for most melanoma types, especially when melanoma depth or location precludes Mohs. Standardized margins (typically 1-2 cm) are used, and the procedure is less time-intensive than Mohs. Disadvantages: Larger excision sites, requiring more tissue removal, which may result in larger scars. Cosmetic outcome may not be as optimal compared to Mohs, especially in cosmetically sensitive areas; non complete margin assessment  Following Mohs surgery, various  reconstructive options are available depending on the size, location, and depth of the excised defect. For smaller defects, primary closure may be performed, where the wound is directly sutured together to restore normal tissue continuity and function. For moderate-sized defects, a local flap or skin graft may be considered to cover the wound while maintaining the cosmetic appearance and function of the affected area. Local flaps involve rotating, advancing, or transposing nearby skin to close the defect, ensuring a good match in skin texture and color. For larger defects, particularly in areas such as the face or hands, more complex reconstructive techniques, such as regional or distant flaps, may be necessary. These methods involve transferring tissue from adjacent or distant sites to cover the defect, ensuring both aesthetic and functional restoration. The choice of reconstructive method will depend on the location of the defect, the amount of tissue removed, and the patient's cosmetic goals. The risks, benefits, and potential outcomes of each option will be discussed with the patient during the surgical planning phase.  Slow Mohs Procedure Note The patient underwent the first stage of Slow Mohs surgery for the excision of a suspected skin malignancy located on the left pretibial region. After appropriate local anesthesia was administered, the area was prepped and draped in a sterile fashion. A thin layer of tissue was carefully excised, ensuring clear margins around the lesion. The tissue was immediately sent for microscopic evaluation in the pathology lab. The patient tolerated the procedure well with minimal discomfort. A sterile dressing was applied, and the patient was instructed on post-operative care, including keeping the site clean and dry. Xeroform was sutured into the dressing with the appropriate section markings. The patient will return for further stages based on the pathology results, with  follow-up scheduled for re-evaluation and continued treatment if  necessary. 6 pieces and the debulk were sent to pathology.    Return in 1 day (on 05/26/2023) for at 2:45 PM for slow mohs stage or closure.  LILLETTE Berwyn Baseman, Surg Tech III, am acting as scribe for RUFUS CHRISTELLA HOLY, MD.   Documentation: I have reviewed the above documentation for accuracy and completeness, and I agree with the above.  RUFUS CHRISTELLA HOLY, MD

## 2023-05-25 NOTE — Patient Instructions (Signed)
 How to Minimize Scarring After Surgery Scarring is a risk of any surgery that involves cutting the skin (making an incision). You can reduce scarring by following instructions from your health care provider for care at home after surgery. This includes keeping your incision clean, moist, and protected from the sun. What are the risks? Every person scars differently. Factors that affect how you scar include: Which surgery technique was used. Where the incision was made on your body. Your overall health. Your age. Your skin. Some medicines. How to minimize scarring after surgery Right after surgery     Follow instructions from your health care provider about how to take care of your incision. Make sure you: Wash your hands with soap and water for at least 20 seconds before and after you change your bandage (dressing). If soap and water are not available, use hand sanitizer. Change your dressing as told by your health care provider. Leave stitches (sutures), skin glue, or adhesive strips in place. These skin closures may need to be in place for 2 weeks or longer. If adhesive strip edges start to loosen and curl up, you may trim the loose edges. Do not remove adhesive strips completely unless your health care provider tells you to do that. Check your incision area every day for signs of infection. Check for: Redness, swelling, or pain. Fluid or blood. Warmth. Pus or a bad smell. If directed, apply antibiotic ointment or another ointment, such as petroleum jelly, to the incision to keep it moist until it heals fully. You may need to moisten two times per day for about 2 weeks. If you were prescribed antibiotic ointment, use it as told by your health care provider. Do not stop using the antibiotic even if your condition improves. Get sutures taken out at the scheduled time. Avoid touching your incision unless needed.  After your skin has healed Keep your scar protected from the sun. Cover the  scar with sunscreen that has an SPF of 30 or higher. Do not put sunscreen on your scar until it has healed. Gently massage the scar using a circular motion. This will help to minimize the appearance of the scar. Do this only after the incision has closed and all the sutures have been removed. Remember that the scar may appear lighter or darker than your normal skin color. This difference in color may even out with time. If your scar does not fade or go away with time and you do not like how it looks, consider talking with a plastic surgeon or a dermatologist. Follow these instructions at home: Do not use any products that contain nicotine or tobacco. These products include cigarettes, chewing tobacco, and vaping devices, such as e-cigarettes. These can delay healing. If you need help quitting, ask your health care provider. Follow all restrictions, such as limits on exercise or work. What you should do and should not do will depend on where your incision is located. Keep all follow-up visits. This is important to check on healing. Contact a health care provider if: Your sutures come out or adhesive strips come off before your health care provider said they would. You have a fever or chills. You have any signs of infection. You think that you are having a reaction to the antibiotic ointment. Get help right away if: You have bleeding from the incision that does not stop. Summary Scarring is a risk of any surgery that involves cutting the skin (making an incision). Follow instructions from your health care  provider about how to take care of your incision to help it heal and to minimize scarring. Keep your incision clean. If directed, apply antibiotic ointment or another ointment, such as petroleum jelly, to the incision. Keep your scar protected from the sun. This information is not intended to replace advice given to you by your health care provider. Make sure you discuss any questions you have  with your health care provider. Document Revised: 08/21/2021 Document Reviewed: 08/21/2021 Elsevier Patient Education  2024 [employer].

## 2023-05-26 ENCOUNTER — Encounter: Payer: Self-pay | Admitting: Dermatology

## 2023-05-26 ENCOUNTER — Ambulatory Visit: Payer: 59 | Admitting: Dermatology

## 2023-05-26 DIAGNOSIS — D0372 Melanoma in situ of left lower limb, including hip: Secondary | ICD-10-CM | POA: Diagnosis not present

## 2023-05-26 DIAGNOSIS — C439 Malignant melanoma of skin, unspecified: Secondary | ICD-10-CM

## 2023-05-26 LAB — DERMATOLOGY PATHOLOGY

## 2023-05-26 LAB — SURGICAL PATHOLOGY

## 2023-05-26 MED ORDER — MUPIROCIN 2 % EX OINT: 1.0000 | TOPICAL_OINTMENT | Freq: Two times a day (BID) | CUTANEOUS | 2 refills | Status: DC

## 2023-05-26 NOTE — Progress Notes (Signed)
 Follow-Up Visit   Subjective  Regina [name] is a 43 y.o. female who presents for the following: closure of a Slow Mohs of an MIS on the left pretibial region, referred by Dr. Bary Likes  The following portions of the chart were reviewed this encounter and updated as appropriate: medications, allergies, medical history  Review of Systems:  No other skin or systemic complaints except as noted in HPI or Assessment and Plan.  Objective  Well appearing patient in no apparent distress; mood and affect are within normal limits.  A focused examination was performed of the following areas: Left pretibial Relevant physical exam findings are noted in the Assessment and Plan.   Left pretibial Granulating wound   Assessment & Plan   Melanoma In Situ- Left Pretibial Region The patient was counseled today regarding the results from the first stage of Slow Mohs surgery, which showed clear margins around the excised lesion. I explained that this means the tumor was completely removed from the initial surgical site, and no further tissue from the excision is needed to achieve clear margins. The patient was reassured that this is a positive outcome, indicating that the cancerous cells have been adequately addressed in the first stage. We discussed the plan for reconstructive surgery, as the wound will now be closed, and the next steps in their treatment. I emphasized the importance of following post-operative care instructions, including wound care and monitoring for signs of infection, as well as maintaining follow-up appointments to ensure optimal healing. The patient was advised to avoid sun exposure and continue regular skin checks as part of ongoing skin cancer surveillance.  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 repair Complexity:  Complex Final length (cm):  6.5 Informed consent: discussed and consent obtained   Timeout: patient name, date of birth, surgical site, and  procedure verified   Procedure prep:  Patient was prepped and draped in usual sterile fashion Prep type:  Chlorhexidine Anesthesia: the lesion was anesthetized in a standard fashion   Anesthetic:  1% lidocaine  w/ epinephrine 1-100,000 buffered w/ 8.4% NaHCO3 Reason for type of repair: reduce tension to allow closure, allow closure of the large defect, preserve normal anatomy, allow side-to-side closure without requiring a flap or graft and compensate for the inelasticity of skin in this area   Undermining: area extensively undermined   Subcutaneous layers (deep stitches):  Suture size:  3-0 Suture type: PDS (polydioxanone)   Stitches:  Buried vertical mattress Fine/surface layer approximation (top stitches):  Suture size:  4-0 Suture type: Prolene (polypropylene)   Stitches: vertical mattress   Hemostasis achieved with: suture, pressure and electrodesiccation Hemostasis achieved with comment:  3.0 PDS, 4.0 prolene Outcome: patient tolerated procedure well with no complications   Post-procedure details: sterile dressing applied and wound care instructions given   Related Medications mupirocin  ointment (BACTROBAN ) 2 % Apply 1 Application topically 2 (two) times daily.  The surgical wound was then cleaned, prepped, and re-anesthetized as above. Wound edges were undermined extensively along at least one entire edge and at a distance equal to or greater than the width of the defect (see wound defect size above) in order to achieve closure and decrease wound tension and anatomic distortion. Redundant tissue repair including standing cone removal was performed. Hemostasis was achieved with electrocautery. Subcutaneous and epidermal tissues were approximated with the above sutures. The surgical site was then lightly scrubbed with sterile, saline-soaked gauze. The area was then bandaged using Vaseline ointment, non-adherent gauze, gauze pads, and tape  to provide an adequate pressure dressing. The  patient tolerated the procedure well, was given detailed written and verbal wound care instructions, and was discharged in good condition.   The patient will follow-up: 12-14 days.   Return in about 2 weeks (around 06/09/2023) for wound check/suture removal.  I, Wilson Hasten, CMA, am acting as scribe for Deneise Finlay, MD.   Documentation: I have reviewed the above documentation for accuracy and completeness, and I agree with the above.  Deneise Finlay, MD

## 2023-05-26 NOTE — Patient Instructions (Signed)

## 2023-06-01 ENCOUNTER — Encounter: Payer: 59 | Admitting: Dermatology

## 2023-06-09 ENCOUNTER — Encounter: Payer: Self-pay | Admitting: Dermatology

## 2023-06-09 ENCOUNTER — Ambulatory Visit (INDEPENDENT_AMBULATORY_CARE_PROVIDER_SITE_OTHER): Payer: 59 | Admitting: Dermatology

## 2023-06-09 DIAGNOSIS — Z4889 Encounter for other specified surgical aftercare: Secondary | ICD-10-CM

## 2023-06-09 DIAGNOSIS — T1490XD Injury, unspecified, subsequent encounter: Secondary | ICD-10-CM

## 2023-06-09 MED ORDER — DOXYCYCLINE HYCLATE 100 MG PO CAPS: 100.0000 mg | ORAL_CAPSULE | Freq: Two times a day (BID) | ORAL | 0 refills | Status: AC

## 2023-06-09 NOTE — Progress Notes (Signed)
   Follow Up Visit   Subjective  Regina [name] is a 43 y.o. female who presents for the following: follow up from Slow Mohs surgery on left shin for MIS  The patient presents for follow up from Mohs surgery for a MIS on the left pretibial region, treated on 05/26/23, repaired with linear closure. The patient has been bandaging the wound as directed. The endorse the following concerns: swelling and redness.  The following portions of the chart were reviewed this encounter and updated as appropriate: medications, allergies, medical history  Review of Systems:  No other skin or systemic complaints except as noted in HPI or Assessment and Plan.  Objective  Well appearing patient in no apparent distress; mood and affect are within normal limits.  A full examination was performed including scalp, head, face and left leg. All findings within normal limits unless otherwise noted below.  Healing wound with mild erythema  Relevant physical exam findings are noted in the Assessment and Plan.    Assessment & Plan   Healing s/p Mohs for MIS on left shin, treated on 05/26/23, repaired with linear closure - Reassured that wound is healing well - No evidence of infection - No swelling, induration, purulence, dehiscence, or tenderness out of proportion to the clinical exam, see photo above - Discussed that scars take up to 12 months to mature from the date of surgery - Recommend SPF 30+ to scar daily to prevent purple color from UV exposure during scar maturation process - Discussed that erythema and raised appearance of scar will fade over the next 4-6 months - OK to start scar massage at 4-6 weeks post-op - Can consider silicone based products for scar healing starting at 6 weeks post-op - Ok to continue ointment daily to wound under a bandage for another week - OK to use compression sleeve  Return in about 2 weeks (around 06/23/2023) for wound check.  I, Tillie Fantasia, CMA, am acting as  scribe for Gwenith Daily, MD.   Documentation: I have reviewed the above documentation for accuracy and completeness, and I agree with the above.  Gwenith Daily, MD

## 2023-06-09 NOTE — Patient Instructions (Signed)
Post-Operative Scar Care: Education and Recommendations  Following your procedure, it's important to care for your scar to promote optimal healing and minimize its appearance. Proper post-operative care can help ensure that the scar heals well, and with time, it may become less noticeable. Below are key recommendations for scar care, including scar massage and the use of silicone scar gels or sheets.  1. General Scar Care Tips: -  Keep the wound clean and dry: Follow your healthcare provider's instructions for wound care, including cleaning the site and changing dressings as needed. -  Avoid sun exposure: Direct sunlight can darken scars and make them more noticeable. Once your wound has healed, apply sunscreen (SPF 30 or higher) to protect the scar from UV rays.  2. Scar Massage: - Start after healing: Wait until the scar has fully healed, with no scabs or open areas (usually 4-6 weeks after surgery). Your healthcare provider will give you specific guidance on when to begin. - Technique: Gently massage the scar in a circular motion for 5-10 minutes, 2-3 times per day. This helps to soften the tissue, reduce swelling, and improve the overall appearance of the scar. - Pressure: Apply gentle, firm pressure during the massage to break down the dense tissue that may form during healing. This helps to prevent the formation of keloids or hypertrophic scars. - Use lotion or ointment: Consider using a mild, fragrance-free lotion or vitamin E ointment to help lubricate the area during massage.  3. Silicone Scar Gels or Sheets: - When to start: Once your wound has healed completely, typically around 4-6 weeks, you can begin using silicone-based scar gels or sheets. These have been shown to improve scar appearance by hydrating the tissue and reducing inflammation. - How to use silicone gels: Apply a thin layer of the gel to the scar and allow it to dry before covering with clothing. You can use the  gel multiple times a day, depending on your provider's recommendation. - How to use silicone sheets: Cut the sheet to fit the size of your scar, and apply it directly to the healed scar. Wear it for 12-24 hours a day, and replace the sheet every few days as directed. - Benefits: Silicone helps reduce redness, flatten the scar, and improve its texture. Continued use over several months can lead to significant improvement in the appearance of the scar.  4. What to Expect: - Healing process: Scars generally take time to mature. The first few months may show redness or swelling, but this usually improves as healing progresses. - Long-term care: Scarring is a natural part of the healing process. While you cannot completely eliminate a scar, proper care can significantly improve its appearance over time. - Patience: It can take up to a year for a scar to fully mature, so it's important to be consistent with scar care and follow-up appointments with your provider.  5. When to Contact Your Healthcare Provider: - If you notice signs of infection (increased redness, warmth, drainage, or pain). - If your scar becomes unusually raised, itchy, or changes in color significantly. - If you have concerns about the appearance of your scar or experience unusual symptoms. - By following these guidelines, you can support your body's natural healing process and help ensure the best possible outcome for your scar. If you have any questions or concerns, please don't hesitate to contact our office.    Important Information   Due to recent changes in healthcare laws, you may  see results of your pathology and/or laboratory studies on MyChart before the doctors have had a chance to review them. We understand that in some cases there may be results that are confusing or concerning to you. Please understand that not all results are received at the same time and often the doctors may need to interpret multiple results in order  to provide you with the best plan of care or course of treatment. Therefore, we ask that you please give [place] 2 business days to thoroughly review all your results before contacting the office for clarification. Should we see a critical lab result, you will be contacted sooner.     If You Need Anything After Your Visit   If you have any questions or concerns for your doctor, please call our main line at [phone]. If no one answers, please leave a voicemail as directed and we will return your call as soon as possible. Messages left after 4 pm will be answered the following business day.    You may also send [place] a message via MyChart. We typically respond to MyChart messages within 1-2 business days.  For prescription refills, please ask your pharmacy to contact our office. Our fax number is [phone].  If you have an urgent issue when the clinic is closed that cannot wait until the next business day, you can page your doctor at the number below.     Please note that while we do our best to be available for urgent issues outside of office hours, we are not available 24/7.    If you have an urgent issue and are unable to reach [place], you may choose to seek medical care at your doctor's office, retail clinic, urgent care center, or emergency room.   If you have a medical emergency, please immediately call 911 or go to the emergency department. In the event of inclement weather, please call our main line at [phone] for an update on the status of any delays or closures.  Dermatology Medication Tips: Please keep the boxes that topical medications come in in order to help keep track of the instructions about where and how to use these. Pharmacies typically print the medication instructions only on the boxes and not directly on the medication tubes.   If your medication is too expensive, please contact our office at [phone] or send [place] a message through MyChart.    We are unable to tell  what your co-pay for medications will be in advance as this is different depending on your insurance coverage. However, we may be able to find a substitute medication at lower cost or fill out paperwork to get insurance to cover a needed medication.    If a prior authorization is required to get your medication covered by your insurance company, please allow [place] 1-2 business days to complete this process.   Drug prices often vary depending on where the prescription is filled and some pharmacies may offer cheaper prices.   The website www.goodrx.com contains coupons for medications through different pharmacies. The prices here do not account for what the cost may be with help from insurance (it may be cheaper with your insurance), but the website can give you the price if you did not use any insurance.  - You can print the associated coupon and take it with your prescription to the pharmacy.  - You may also stop by our office during regular business hours and pick up a GoodRx coupon card.  - If  you need your prescription sent electronically to a different pharmacy, notify our office through [facility] or by phone at [phone]

## 2023-06-15 DIAGNOSIS — F331 Major depressive disorder, recurrent, moderate: Secondary | ICD-10-CM | POA: Diagnosis not present

## 2023-06-30 ENCOUNTER — Ambulatory Visit: Payer: 59 | Admitting: Dermatology

## 2023-07-02 DIAGNOSIS — C20 Malignant neoplasm of rectum: Secondary | ICD-10-CM | POA: Diagnosis not present

## 2023-07-02 DIAGNOSIS — F900 Attention-deficit hyperactivity disorder, predominantly inattentive type: Secondary | ICD-10-CM | POA: Diagnosis not present

## 2023-07-02 DIAGNOSIS — Z0000 Encounter for general adult medical examination without abnormal findings: Secondary | ICD-10-CM | POA: Diagnosis not present

## 2023-07-02 DIAGNOSIS — Z1231 Encounter for screening mammogram for malignant neoplasm of breast: Secondary | ICD-10-CM | POA: Diagnosis not present

## 2023-07-02 DIAGNOSIS — K5900 Constipation, unspecified: Secondary | ICD-10-CM | POA: Diagnosis not present

## 2023-07-02 DIAGNOSIS — R5383 Other fatigue: Secondary | ICD-10-CM | POA: Diagnosis not present

## 2023-07-02 DIAGNOSIS — R194 Change in bowel habit: Secondary | ICD-10-CM | POA: Diagnosis not present

## 2023-07-05 ENCOUNTER — Other Ambulatory Visit: Payer: Self-pay | Admitting: Family Medicine

## 2023-07-05 DIAGNOSIS — Z1231 Encounter for screening mammogram for malignant neoplasm of breast: Secondary | ICD-10-CM

## 2023-07-06 ENCOUNTER — Ambulatory Visit: Payer: 59 | Admitting: Dermatology

## 2023-07-06 ENCOUNTER — Encounter: Payer: Self-pay | Admitting: Dermatology

## 2023-07-06 VITALS — BP 152/93

## 2023-07-06 DIAGNOSIS — T1490XD Injury, unspecified, subsequent encounter: Secondary | ICD-10-CM

## 2023-07-06 DIAGNOSIS — D0372 Melanoma in situ of left lower limb, including hip: Secondary | ICD-10-CM

## 2023-07-06 DIAGNOSIS — Z86006 Personal history of melanoma in-situ: Secondary | ICD-10-CM | POA: Diagnosis not present

## 2023-07-06 DIAGNOSIS — L539 Erythematous condition, unspecified: Secondary | ICD-10-CM

## 2023-07-06 MED ORDER — DOXYCYCLINE HYCLATE 50 MG PO CAPS: 50.0000 mg | ORAL_CAPSULE | Freq: Every day | ORAL | 1 refills | Status: AC

## 2023-07-06 NOTE — Patient Instructions (Signed)

## 2023-07-06 NOTE — Progress Notes (Signed)
   Follow Up Visit   Subjective  Regina [name] is a 43 y.o. female who presents for the following: follow up from Mohs surgery   The patient presents for follow up from Mohs surgery for a MIS on the left shin, treated on 05/26/23, repaired with Linear closure. The patient has been bandaging the wound as directed. The endorse the following concerns: mild concern for dehiscence  The following portions of the chart were reviewed this encounter and updated as appropriate: medications, allergies, medical history  Review of Systems:  No other skin or systemic complaints except as noted in HPI or Assessment and Plan.  Objective  Well appearing patient in no apparent distress; mood and affect are within normal limits.  A full examination was performed including scalp, head, face and left leg. All findings within normal limits unless otherwise noted below.  Healing wound with mild erythema  Relevant physical exam findings are noted in the Assessment and Plan.    Assessment & Plan   Healing s/p Mohs for MIS, treated on 05/25/23, repaired with linear closure. - Reassured that wound is healing well - No evidence of infection - No swelling, induration, purulence, dehiscence, or tenderness out of proportion to the clinical exam, see photo above - Discussed that scars take up to 12 months to mature from the date of surgery - Recommend SPF 30+ to scar daily to prevent purple color from UV exposure during scar maturation process - Discussed that erythema and raised appearance of scar will fade over the next 4-6 months - OK to start scar massage at 4-6 weeks post-op - Can consider silicone based products for scar healing starting at 6 weeks post-op - Ok to start Strata Gel GRT along wound base then continue ointment daily to wound under a bandage for another 2-4 weeks or until completely healed.  HISTORY OF MELANOMA IN SITU - No evidence of recurrence today - Recommend regular full body skin  exams - Recommend daily broad spectrum sunscreen SPF 30+ to sun-exposed areas, reapply every 2 hours as needed.  - Call if any new or changing lesions are noted between office visits    No follow-ups on file.  Dominga Ferry, Surg Tech III, am acting as scribe for Gwenith Daily, MD.   Documentation: I have reviewed the above documentation for accuracy and completeness, and I agree with the above.  Gwenith Daily, MD

## 2023-07-28 ENCOUNTER — Ambulatory Visit: Payer: 59 | Admitting: Dermatology

## 2023-07-28 ENCOUNTER — Encounter: Payer: Self-pay | Admitting: Dermatology

## 2023-07-28 VITALS — BP 156/91 | HR 87

## 2023-07-28 DIAGNOSIS — L570 Actinic keratosis: Secondary | ICD-10-CM | POA: Diagnosis not present

## 2023-07-28 DIAGNOSIS — T1490XD Injury, unspecified, subsequent encounter: Secondary | ICD-10-CM

## 2023-07-28 DIAGNOSIS — L578 Other skin changes due to chronic exposure to nonionizing radiation: Secondary | ICD-10-CM | POA: Diagnosis not present

## 2023-07-28 DIAGNOSIS — L814 Other melanin hyperpigmentation: Secondary | ICD-10-CM

## 2023-07-28 DIAGNOSIS — D1801 Hemangioma of skin and subcutaneous tissue: Secondary | ICD-10-CM

## 2023-07-28 DIAGNOSIS — L821 Other seborrheic keratosis: Secondary | ICD-10-CM | POA: Diagnosis not present

## 2023-07-28 DIAGNOSIS — Z86006 Personal history of melanoma in-situ: Secondary | ICD-10-CM | POA: Diagnosis not present

## 2023-07-28 DIAGNOSIS — W908XXA Exposure to other nonionizing radiation, initial encounter: Secondary | ICD-10-CM

## 2023-07-28 DIAGNOSIS — Z1283 Encounter for screening for malignant neoplasm of skin: Secondary | ICD-10-CM

## 2023-07-28 DIAGNOSIS — L539 Erythematous condition, unspecified: Secondary | ICD-10-CM

## 2023-07-28 DIAGNOSIS — D229 Melanocytic nevi, unspecified: Secondary | ICD-10-CM

## 2023-07-28 DIAGNOSIS — D0372 Melanoma in situ of left lower limb, including hip: Secondary | ICD-10-CM

## 2023-07-28 NOTE — Progress Notes (Signed)
 Follow Up Visit   Subjective  Regina [name] is a 43 y.o. female who presents for the following: follow up from Mohs surgery and Skin Cancer Screening and Full Body Skin Exam. Hx of MIS   The patient presents for follow up from Mohs surgery for a MIS on the left shin, treated on 05/26/23, repaired with linear closure. The patient has been bandaging the wound as directed. The endorse the following concerns: still a little sore.   The patient presents for Total-Body Skin Exam (TBSE) for skin cancer screening and mole check. The patient has spots, moles and lesions to be evaluated, some may be new or changing.  The following portions of the chart were reviewed this encounter and updated as appropriate: medications, allergies, medical history  Review of Systems:  No other skin or systemic complaints except as noted in HPI or Assessment and Plan.  Objective  Well appearing patient in no apparent distress; mood and affect are within normal limits.  A full examination was performed including scalp, head, face and left leg. All findings within normal limits unless otherwise noted below.  Healing wound with mild erythema  Relevant physical exam findings are noted in the Assessment and Plan.  Right Zygomatic Area Erythematous thin papules/macules with gritty scale.   Assessment & Plan   Healing s/p Mohs for MIS, treated on 05/26/23, repaired with linear closure. - Reassured that wound is healing well - No evidence of infection - No swelling, induration, purulence, dehiscence, or tenderness out of proportion to the clinical exam, see photo above - Discussed that scars take up to 12 months to mature from the date of surgery - Recommend SPF 30+ to scar daily to prevent purple color from UV exposure during scar maturation process - Discussed that erythema and raised appearance of scar will fade over the next 4-6 months - OK to start scar massage at 4-6 weeks post-op - Can consider silicone  based products for scar healing starting at 6 weeks post-op - Ok to continue ointment daily to wound under a bandage for another 2-3 weeks or until completely healed.   SKIN CANCER SCREENING PERFORMED TODAY.  ACTINIC DAMAGE - Chronic condition, secondary to cumulative UV/sun exposure - diffuse scaly erythematous macules with underlying dyspigmentation - Recommend daily broad spectrum sunscreen SPF 30+ to sun-exposed areas, reapply every 2 hours as needed.  - Staying in the shade or wearing long sleeves, sun glasses (UVA+UVB protection) and wide brim hats (4-inch brim around the entire circumference of the hat) are also recommended for sun protection.  - Call for new or changing lesions.  LENTIGINES, SEBORRHEIC KERATOSES, HEMANGIOMAS - Benign normal skin lesions - Benign-appearing - Call for any changes  MELANOCYTIC NEVI - Tan-brown and/or pink-flesh-colored symmetric macules and papules - Benign appearing on exam today - Observation - Call clinic for new or changing moles - Recommend daily use of broad spectrum spf 30+ sunscreen to sun-exposed areas.   HISTORY OF MELANOMA - No evidence of recurrence today - No lymphadenopathy - Recommend regular full body skin exams - Recommend daily broad spectrum sunscreen SPF 30+ to sun-exposed areas, reapply every 2 hours as needed.  - Call if any new or changing lesions are noted between office visits  ACTINIC KERATOSIS Exam: Erythematous thin papules/macules with gritty scale  Actinic keratoses are precancerous spots that appear secondary to cumulative UV radiation exposure/sun exposure over time. They are chronic with expected duration over 1 year. A portion of actinic keratoses will progress to  squamous cell carcinoma of the skin. It is not possible to reliably predict which spots will progress to skin cancer and so treatment is recommended to prevent development of skin cancer.  Recommend daily broad spectrum sunscreen SPF 30+ to  sun-exposed areas, reapply every 2 hours as needed.  Recommend staying in the shade or wearing long sleeves, sun glasses (UVA+UVB protection) and wide brim hats (4-inch brim around the entire circumference of the hat). Call for new or changing lesions.  Treatment Plan:  Prior to procedure, discussed risks of blister formation, small wound, skin dyspigmentation, or rare scar following cryotherapy. Recommend Vaseline ointment to treated areas while healing.  Destruction Procedure Note Destruction method: cryotherapy   Informed consent: discussed and consent obtained   Lesion destroyed using liquid nitrogen: Yes   Outcome: patient tolerated procedure well with no complications   Post-procedure details: wound care instructions given   Locations: right lateral canthus # of Lesions Treated: 1 AK (ACTINIC KERATOSIS) Right Zygomatic Area Destruction of lesion - Right Zygomatic Area Complexity: simple   Destruction method: cryotherapy   Informed consent: discussed and consent obtained   Timeout:  patient name, date of birth, surgical site, and procedure verified Lesion destroyed using liquid nitrogen: Yes   Region frozen until ice ball extended beyond lesion: Yes   Cryotherapy cycles:  2 Outcome: patient tolerated procedure well with no complications   HEALING WOUND   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  I, Manual Meier, Surg Tech III, am acting as scribe for Gwenith Daily, MD.   We spent 45 min reviewing records, taking the patient history, providing face to face care with the patient, sending prescriptions.   Documentation: I have reviewed the above documentation for accuracy and completeness, and I agree with the above.  Gwenith Daily, MD

## 2023-07-28 NOTE — Patient Instructions (Addendum)

## 2023-10-28 ENCOUNTER — Encounter: Payer: Self-pay | Admitting: Dermatology

## 2023-10-28 ENCOUNTER — Ambulatory Visit: Admitting: Dermatology

## 2023-10-28 VITALS — BP 114/84 | HR 80

## 2023-10-28 DIAGNOSIS — L814 Other melanin hyperpigmentation: Secondary | ICD-10-CM | POA: Diagnosis not present

## 2023-10-28 DIAGNOSIS — D492 Neoplasm of unspecified behavior of bone, soft tissue, and skin: Secondary | ICD-10-CM | POA: Diagnosis not present

## 2023-10-28 DIAGNOSIS — L821 Other seborrheic keratosis: Secondary | ICD-10-CM | POA: Diagnosis not present

## 2023-10-28 DIAGNOSIS — D1801 Hemangioma of skin and subcutaneous tissue: Secondary | ICD-10-CM | POA: Diagnosis not present

## 2023-10-28 DIAGNOSIS — L578 Other skin changes due to chronic exposure to nonionizing radiation: Secondary | ICD-10-CM | POA: Diagnosis not present

## 2023-10-28 DIAGNOSIS — Z1283 Encounter for screening for malignant neoplasm of skin: Secondary | ICD-10-CM

## 2023-10-28 DIAGNOSIS — W908XXA Exposure to other nonionizing radiation, initial encounter: Secondary | ICD-10-CM

## 2023-10-28 DIAGNOSIS — D229 Melanocytic nevi, unspecified: Secondary | ICD-10-CM

## 2023-10-28 DIAGNOSIS — Z86006 Personal history of melanoma in-situ: Secondary | ICD-10-CM | POA: Diagnosis not present

## 2023-10-28 NOTE — Progress Notes (Signed)
   Follow-Up Visit   Subjective  Regina [name] is a 43 y.o. female who presents for the following: Skin Cancer Screening and Full Body Skin Exam  The patient presents for Total-Body Skin Exam (TBSE) for skin cancer screening and mole check. The patient has spots, moles and lesions to be evaluated, some may be new or changing.  The following portions of the chart were reviewed this encounter and updated as appropriate: medications, allergies, medical history  Review of Systems:  No other skin or systemic complaints except as noted in HPI or Assessment and Plan.  Objective  Well appearing patient in no apparent distress; mood and affect are within normal limits.  A full examination was performed including scalp, head, eyes, ears, nose, lips, neck, chest, axillae, abdomen, back, buttocks, bilateral upper extremities, bilateral lower extremities, hands, feet, fingers, toes, fingernails, and toenails. All findings within normal limits unless otherwise noted below.   Relevant physical exam findings are noted in the Assessment and Plan.     Assessment & Plan   SKIN CANCER SCREENING PERFORMED TODAY.  ACTINIC DAMAGE - Chronic condition, secondary to cumulative UV/sun exposure - diffuse scaly erythematous macules with underlying dyspigmentation - Recommend daily broad spectrum sunscreen SPF 30+ to sun-exposed areas, reapply every 2 hours as needed.  - Staying in the shade or wearing long sleeves, sun glasses (UVA+UVB protection) and wide brim hats (4-inch brim around the entire circumference of the hat) are also recommended for sun protection.  - Call for new or changing lesions.  LENTIGINES, SEBORRHEIC KERATOSES, HEMANGIOMAS - Benign normal skin lesions - Benign-appearing - Call for any changes  MELANOCYTIC NEVI - Tan-brown and/or pink-flesh-colored symmetric macules and papules - Benign appearing on exam today - Observation - Call clinic for new or changing moles - Recommend  daily use of broad spectrum spf 30+ sunscreen to sun-exposed areas.   HISTORY OF MELANOMA IN SITU  - No evidence of recurrence today - Recommend regular full body skin exams - Recommend daily broad spectrum sunscreen SPF 30+ to sun-exposed areas, reapply every 2 hours as needed.  - Call if any new or changing lesions are noted between office visits  - No cervical, axillary or inguinal lymphadenopathy.   Congenital Melanocytic Nevus- left thigh - Will monitor for changes  Neoplasm of Skin- Inflammatory papule vs Sebaceous hyperplasia vs other- chin - Photo taken ,will monitor for changes  No follow-ups on file.  I, Haig Levan, Surg Tech III, am acting as scribe for Deneise Finlay, MD.   Documentation: I have reviewed the above documentation for accuracy and completeness, and I agree with the above.  Deneise Finlay, MD

## 2023-10-28 NOTE — Patient Instructions (Signed)

## 2024-01-05 ENCOUNTER — Ambulatory Visit: Admitting: Dermatology

## 2024-01-05 ENCOUNTER — Encounter: Payer: Self-pay | Admitting: Dermatology

## 2024-01-05 ENCOUNTER — Ambulatory Visit: Payer: 59 | Admitting: Dermatology

## 2024-01-05 DIAGNOSIS — D485 Neoplasm of uncertain behavior of skin: Secondary | ICD-10-CM

## 2024-01-05 DIAGNOSIS — D2261 Melanocytic nevi of right upper limb, including shoulder: Secondary | ICD-10-CM | POA: Diagnosis not present

## 2024-01-05 DIAGNOSIS — D2271 Melanocytic nevi of right lower limb, including hip: Secondary | ICD-10-CM

## 2024-01-05 DIAGNOSIS — Z86018 Personal history of other benign neoplasm: Secondary | ICD-10-CM

## 2024-01-05 DIAGNOSIS — W908XXA Exposure to other nonionizing radiation, initial encounter: Secondary | ICD-10-CM | POA: Diagnosis not present

## 2024-01-05 DIAGNOSIS — D224 Melanocytic nevi of scalp and neck: Secondary | ICD-10-CM | POA: Diagnosis not present

## 2024-01-05 DIAGNOSIS — L578 Other skin changes due to chronic exposure to nonionizing radiation: Secondary | ICD-10-CM

## 2024-01-05 DIAGNOSIS — D225 Melanocytic nevi of trunk: Secondary | ICD-10-CM | POA: Diagnosis not present

## 2024-01-05 DIAGNOSIS — C20 Malignant neoplasm of rectum: Secondary | ICD-10-CM | POA: Diagnosis not present

## 2024-01-05 DIAGNOSIS — D2221 Melanocytic nevi of right ear and external auricular canal: Secondary | ICD-10-CM

## 2024-01-05 DIAGNOSIS — Z9189 Other specified personal risk factors, not elsewhere classified: Secondary | ICD-10-CM | POA: Diagnosis not present

## 2024-01-05 DIAGNOSIS — L814 Other melanin hyperpigmentation: Secondary | ICD-10-CM

## 2024-01-05 DIAGNOSIS — D239 Other benign neoplasm of skin, unspecified: Secondary | ICD-10-CM | POA: Diagnosis not present

## 2024-01-05 DIAGNOSIS — Z85820 Personal history of malignant melanoma of skin: Secondary | ICD-10-CM

## 2024-01-05 DIAGNOSIS — K5900 Constipation, unspecified: Secondary | ICD-10-CM | POA: Diagnosis not present

## 2024-01-05 DIAGNOSIS — Z1283 Encounter for screening for malignant neoplasm of skin: Secondary | ICD-10-CM

## 2024-01-05 DIAGNOSIS — D229 Melanocytic nevi, unspecified: Secondary | ICD-10-CM

## 2024-01-05 MED ORDER — MUPIROCIN 2 % EX OINT: TOPICAL_OINTMENT | CUTANEOUS | 6 refills | Status: DC

## 2024-01-05 NOTE — Patient Instructions (Addendum)

## 2024-01-05 NOTE — Progress Notes (Signed)
 Follow-Up Visit   Subjective  Regina [name] is a 43 y.o. female who presents for the following: Skin Cancer Screening and Full Body Skin Exam  The patient presents for Total-Body Skin Exam (TBSE) for skin cancer screening and mole check. The patient has spots, moles and lesions to be evaluated, some may be new or changing and the patient may have concern these could be cancer.  The following portions of the chart were reviewed this encounter and updated as appropriate: medications, allergies, medical history  Review of Systems:  No other skin or systemic complaints except as noted in HPI or Assessment and Plan.  Objective  Well appearing patient in no apparent distress; mood and affect are within normal limits.  A full examination was performed including scalp, head, eyes, ears, nose, lips, neck, chest, axillae, abdomen, back, buttocks, bilateral upper extremities, bilateral lower extremities, hands, feet, fingers, toes, fingernails, and toenails. All findings within normal limits unless otherwise noted below.   Relevant physical exam findings are noted in the Assessment and Plan.  R post auricular 0.6 x 0.4 irregular brown macule   R inframammary 0.8 x 0.4 cm irregular brown macule.  R med under side breast 0.7 cm irregular brown macule.  Assessment & Plan   SKIN CANCER SCREENING PERFORMED TODAY.  ACTINIC DAMAGE - Chronic condition, secondary to cumulative UV/sun exposure - diffuse scaly erythematous macules with underlying dyspigmentation - Recommend daily broad spectrum sunscreen SPF 30+ to sun-exposed areas, reapply every 2 hours as needed.  - Staying in the shade or wearing long sleeves, sun glasses (UVA+UVB protection) and wide brim hats (4-inch brim around the entire circumference of the hat) are also recommended for sun protection.  - Call for new or changing lesions.  LENTIGINES, SEBORRHEIC KERATOSES, HEMANGIOMAS - Benign normal skin lesions -  Benign-appearing - Call for any changes  MELANOCYTIC NEVI      - R middle to distal pretibial 0.4 x 0.3 cm regular brown macule.  0.5 x 0.4 cm R prox lat forearm regular brown macule. - Tan-brown and/or pink-flesh-colored symmetric macules and papules - Benign appearing on exam today - Observation - Call clinic for new or changing moles - Recommend daily use of broad spectrum spf 30+ sunscreen to sun-exposed areas.   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 - Recommend regular full body skin exams - Recommend daily broad spectrum sunscreen SPF 30+ to sun-exposed areas, reapply every 2 hours as needed.  - Call if any new or changing lesions are noted between office visits  HISTORY OF DYSPLASTIC NEVI - multiple, see problem list No evidence of recurrence today Recommend regular full body skin exams Recommend daily broad spectrum sunscreen SPF 30+ to sun-exposed areas, reapply every 2 hours as needed.  Call if any new or changing lesions are noted between office visits  NEOPLASM OF UNCERTAIN BEHAVIOR OF SKIN (3) R post auricular Epidermal / dermal shaving  Lesion diameter (cm):  0.6 Informed consent: discussed and consent obtained   Timeout: patient name, date of birth, surgical site, and procedure verified   Procedure prep:  Patient was prepped and draped in usual sterile fashion Prep type:  Isopropyl alcohol Anesthesia: the lesion was anesthetized in a standard fashion   Anesthetic:  1% lidocaine  w/ epinephrine 1-100,000 buffered w/ 8.4% NaHCO3 Instrument used: flexible  razor blade   Hemostasis achieved with: pressure, aluminum chloride and electrodesiccation   Outcome: patient tolerated procedure well   Post-procedure details: sterile dressing applied and wound care instructions given   Dressing type: bandage (Mupirocin  2% ointment)    Specimen 1 - Surgical pathology Differential Diagnosis: D48.5 r/o dysplastic nevus Check Margins: Yes R inframammary Epidermal / dermal shaving  Lesion diameter (cm):  0.7 Informed consent: discussed and consent obtained   Timeout: patient name, date of birth, surgical site, and procedure verified   Procedure prep:  Patient was prepped and draped in usual sterile fashion Prep type:  Isopropyl alcohol Anesthesia: the lesion was anesthetized in a standard fashion   Anesthetic:  1% lidocaine  w/ epinephrine 1-100,000 buffered w/ 8.4% NaHCO3 Instrument used: flexible razor blade   Hemostasis achieved with: pressure, aluminum chloride and electrodesiccation   Outcome: patient tolerated procedure well   Post-procedure details: sterile dressing applied and wound care instructions given   Dressing type: bandage (Mupirocin  2% ointment)    Epidermal / dermal shaving  Lesion diameter (cm):  0.8 Informed consent: discussed and consent obtained   Timeout: patient name, date of birth, surgical site, and procedure verified   Procedure prep:  Patient was prepped and draped in usual sterile fashion Prep type:  Isopropyl alcohol Anesthesia: the lesion was anesthetized in a standard fashion   Anesthetic:  1% lidocaine  w/ epinephrine 1-100,000 buffered w/ 8.4% NaHCO3 Instrument used: flexible razor blade   Hemostasis achieved with: pressure, aluminum chloride and electrodesiccation   Outcome: patient tolerated procedure well   Post-procedure details: sterile dressing applied and wound care instructions given   Dressing type: bandage (Mupirocin  2% ointment)    Specimen 2 - Surgical pathology Differential Diagnosis: D48.5 r/o  dysplastic nevus Check Margins: Yes R med under side breast Epidermal / dermal shaving  Lesion diameter (cm):  0.7 Informed consent: discussed and consent obtained   Timeout: patient name, date of birth, surgical site, and procedure verified   Procedure prep:  Patient was prepped and draped in usual sterile fashion Prep type:  Isopropyl alcohol Anesthesia: the lesion was anesthetized in a standard fashion   Anesthetic:  1% lidocaine  w/ epinephrine 1-100,000 buffered w/ 8.4% NaHCO3 Instrument used: flexible razor blade   Hemostasis achieved with: pressure, aluminum chloride and electrodesiccation   Outcome: patient tolerated procedure well   Post-procedure details: sterile dressing applied and wound care instructions given   Dressing type: bandage (Mupirocin  2% ointment)    Specimen 3 - Surgical pathology Differential Diagnosis: D48.5 r/o dysplastic nevus  Check Margins: No  Return in about 3 months (around 04/06/2024) for TBSE - hx MMIS, MM, dysplastic nevi.  LILLETTE Rosina Mayans, CMA, am acting as scribe for Alm Rhyme, MD .  Documentation: I have reviewed the above documentation for accuracy and completeness, and I agree with the above.  Alm Rhyme, MD

## 2024-01-11 LAB — SURGICAL PATHOLOGY

## 2024-01-12 ENCOUNTER — Encounter: Payer: Self-pay | Admitting: Dermatology

## 2024-01-12 ENCOUNTER — Ambulatory Visit: Payer: Self-pay | Admitting: Dermatology

## 2024-01-12 NOTE — Telephone Encounter (Signed)
 Advised pt of bx results/sh ?

## 2024-01-12 NOTE — Telephone Encounter (Signed)
-----   Message from Alm Rhyme sent at 01/12/2024  1:47 PM EDT ----- FINAL DIAGNOSIS        1. Skin, R postauricular :       DYSPLASTIC COMPOUND NEVUS WITH MODERATE ATYPIA, CLOSE TO MARGIN        2. Skin, R inframammary :       DYSPLASTIC COMPOUND NEVUS WITH MODERATE ATYPIA, CLOSE TO MARGIN        3. Skin, R med under side breast :       MELANOCYTIC NEVUS, COMPOUND TYPE, BASE INVOLVED     1&2 - Both Moderate dysplastic Recheck next visit 3- Benign mole No further treatment needed ----- Message ----- From: Interface, Lab In [facility] One Sent: 01/11/2024   4:44 PM EDT To: Alm JAYSON Rhyme, MD

## 2024-01-18 ENCOUNTER — Ambulatory Visit
Admission: RE | Admit: 2024-01-18 | Discharge: 2024-01-18 | Disposition: A | Discharge status: Home / Self Care | Source: Ambulatory Visit | Attending: Family Medicine | Admitting: Family Medicine

## 2024-01-18 DIAGNOSIS — Z1231 Encounter for screening mammogram for malignant neoplasm of breast: Secondary | ICD-10-CM | POA: Insufficient documentation

## 2024-01-19 DIAGNOSIS — R110 Nausea: Secondary | ICD-10-CM | POA: Diagnosis not present

## 2024-01-19 DIAGNOSIS — G43109 Migraine with aura, not intractable, without status migrainosus: Secondary | ICD-10-CM | POA: Diagnosis not present

## 2024-01-19 DIAGNOSIS — F900 Attention-deficit hyperactivity disorder, predominantly inattentive type: Secondary | ICD-10-CM | POA: Diagnosis not present

## 2024-01-19 DIAGNOSIS — E041 Nontoxic single thyroid nodule: Secondary | ICD-10-CM | POA: Diagnosis not present

## 2024-01-19 DIAGNOSIS — Z0000 Encounter for general adult medical examination without abnormal findings: Secondary | ICD-10-CM | POA: Diagnosis not present

## 2024-01-19 DIAGNOSIS — R1011 Right upper quadrant pain: Secondary | ICD-10-CM | POA: Diagnosis not present

## 2024-01-21 ENCOUNTER — Other Ambulatory Visit: Payer: Self-pay | Admitting: Family Medicine

## 2024-01-21 ENCOUNTER — Ambulatory Visit
Admission: RE | Admit: 2024-01-21 | Discharge: 2024-01-21 | Disposition: A | Discharge status: Home / Self Care | Source: Ambulatory Visit | Attending: Family Medicine | Admitting: Family Medicine

## 2024-01-21 ENCOUNTER — Encounter: Payer: Self-pay | Admitting: Family Medicine

## 2024-01-21 DIAGNOSIS — R1011 Right upper quadrant pain: Secondary | ICD-10-CM | POA: Diagnosis not present

## 2024-01-21 DIAGNOSIS — G43109 Migraine with aura, not intractable, without status migrainosus: Secondary | ICD-10-CM | POA: Diagnosis not present

## 2024-01-21 DIAGNOSIS — R110 Nausea: Secondary | ICD-10-CM | POA: Diagnosis not present

## 2024-01-21 DIAGNOSIS — F900 Attention-deficit hyperactivity disorder, predominantly inattentive type: Secondary | ICD-10-CM | POA: Diagnosis not present

## 2024-01-21 DIAGNOSIS — C20 Malignant neoplasm of rectum: Secondary | ICD-10-CM | POA: Diagnosis not present

## 2024-01-21 DIAGNOSIS — K589 Irritable bowel syndrome without diarrhea: Secondary | ICD-10-CM | POA: Diagnosis not present

## 2024-01-26 ENCOUNTER — Other Ambulatory Visit: Payer: Self-pay

## 2024-01-26 ENCOUNTER — Other Ambulatory Visit: Payer: Self-pay | Admitting: Family Medicine

## 2024-01-26 DIAGNOSIS — R928 Other abnormal and inconclusive findings on diagnostic imaging of breast: Secondary | ICD-10-CM

## 2024-01-26 DIAGNOSIS — R110 Nausea: Secondary | ICD-10-CM

## 2024-01-26 DIAGNOSIS — R1011 Right upper quadrant pain: Secondary | ICD-10-CM

## 2024-01-27 ENCOUNTER — Ambulatory Visit: Admission: RE | Admit: 2024-01-27 | Source: Ambulatory Visit

## 2024-01-27 ENCOUNTER — Ambulatory Visit: Admitting: Dermatology

## 2024-01-27 ENCOUNTER — Inpatient Hospital Stay
Admission: RE | Admit: 2024-01-27 | Discharge: 2024-01-27 | Discharge status: Home / Self Care | Source: Ambulatory Visit | Attending: Family Medicine | Admitting: Family Medicine

## 2024-01-27 ENCOUNTER — Ambulatory Visit
Admission: RE | Admit: 2024-01-27 | Discharge: 2024-01-27 | Disposition: A | Discharge status: Home / Self Care | Source: Ambulatory Visit | Attending: Family Medicine | Admitting: Family Medicine

## 2024-01-27 DIAGNOSIS — R928 Other abnormal and inconclusive findings on diagnostic imaging of breast: Secondary | ICD-10-CM

## 2024-01-27 DIAGNOSIS — R92333 Mammographic heterogeneous density, bilateral breasts: Secondary | ICD-10-CM | POA: Diagnosis not present

## 2024-01-27 DIAGNOSIS — N6002 Solitary cyst of left breast: Secondary | ICD-10-CM | POA: Diagnosis not present

## 2024-01-28 ENCOUNTER — Encounter: Payer: Self-pay | Admitting: Radiology

## 2024-01-28 ENCOUNTER — Ambulatory Visit
Admission: RE | Admit: 2024-01-28 | Discharge: 2024-01-28 | Disposition: A | Discharge status: Home / Self Care | Source: Ambulatory Visit

## 2024-01-28 DIAGNOSIS — R110 Nausea: Secondary | ICD-10-CM | POA: Insufficient documentation

## 2024-01-28 DIAGNOSIS — R1011 Right upper quadrant pain: Secondary | ICD-10-CM | POA: Diagnosis not present

## 2024-01-28 MED ORDER — TECHNETIUM TC 99M MEBROFENIN IV KIT
5.0000 | PACK | Freq: Once | INTRAVENOUS | Status: AC | PRN
  Administered 2024-01-28: 5.59 via INTRAVENOUS

## 2024-01-31 ENCOUNTER — Ambulatory Visit

## 2024-03-06 DIAGNOSIS — D125 Benign neoplasm of sigmoid colon: Secondary | ICD-10-CM | POA: Diagnosis not present

## 2024-03-06 DIAGNOSIS — D122 Benign neoplasm of ascending colon: Secondary | ICD-10-CM | POA: Diagnosis not present

## 2024-03-06 DIAGNOSIS — C20 Malignant neoplasm of rectum: Secondary | ICD-10-CM | POA: Diagnosis not present

## 2024-03-06 DIAGNOSIS — Z79899 Other long term (current) drug therapy: Secondary | ICD-10-CM | POA: Diagnosis not present

## 2024-03-06 DIAGNOSIS — Z85038 Personal history of other malignant neoplasm of large intestine: Secondary | ICD-10-CM | POA: Diagnosis not present

## 2024-03-06 DIAGNOSIS — K635 Polyp of colon: Secondary | ICD-10-CM | POA: Diagnosis not present

## 2024-03-06 DIAGNOSIS — Z9889 Other specified postprocedural states: Secondary | ICD-10-CM | POA: Diagnosis not present

## 2024-04-12 ENCOUNTER — Encounter: Payer: Self-pay | Admitting: Dermatology

## 2024-04-12 ENCOUNTER — Ambulatory Visit: Admitting: Dermatology

## 2024-04-12 DIAGNOSIS — Z86018 Personal history of other benign neoplasm: Secondary | ICD-10-CM

## 2024-04-12 DIAGNOSIS — D235 Other benign neoplasm of skin of trunk: Secondary | ICD-10-CM | POA: Diagnosis not present

## 2024-04-12 DIAGNOSIS — L814 Other melanin hyperpigmentation: Secondary | ICD-10-CM

## 2024-04-12 DIAGNOSIS — Z1283 Encounter for screening for malignant neoplasm of skin: Secondary | ICD-10-CM | POA: Diagnosis not present

## 2024-04-12 DIAGNOSIS — W908XXA Exposure to other nonionizing radiation, initial encounter: Secondary | ICD-10-CM | POA: Diagnosis not present

## 2024-04-12 DIAGNOSIS — L578 Other skin changes due to chronic exposure to nonionizing radiation: Secondary | ICD-10-CM | POA: Diagnosis not present

## 2024-04-12 DIAGNOSIS — D2239 Melanocytic nevi of other parts of face: Secondary | ICD-10-CM | POA: Diagnosis not present

## 2024-04-12 DIAGNOSIS — D224 Melanocytic nevi of scalp and neck: Secondary | ICD-10-CM | POA: Diagnosis not present

## 2024-04-12 DIAGNOSIS — D2271 Melanocytic nevi of right lower limb, including hip: Secondary | ICD-10-CM | POA: Diagnosis not present

## 2024-04-12 DIAGNOSIS — L738 Other specified follicular disorders: Secondary | ICD-10-CM | POA: Diagnosis not present

## 2024-04-12 DIAGNOSIS — D229 Melanocytic nevi, unspecified: Secondary | ICD-10-CM

## 2024-04-12 DIAGNOSIS — D492 Neoplasm of unspecified behavior of bone, soft tissue, and skin: Secondary | ICD-10-CM

## 2024-04-12 DIAGNOSIS — D2261 Melanocytic nevi of right upper limb, including shoulder: Secondary | ICD-10-CM | POA: Diagnosis not present

## 2024-04-12 DIAGNOSIS — D225 Melanocytic nevi of trunk: Secondary | ICD-10-CM | POA: Diagnosis not present

## 2024-04-12 DIAGNOSIS — Z85820 Personal history of malignant melanoma of skin: Secondary | ICD-10-CM

## 2024-04-12 MED ORDER — MUPIROCIN 2 % EX OINT: TOPICAL_OINTMENT | CUTANEOUS | 6 refills | Status: AC

## 2024-04-12 NOTE — Progress Notes (Unsigned)
 Follow-Up Visit   Subjective  Regina [name] is a 43 y.o. female who presents for the following: 3 month Skin Cancer Screening and Full Body Skin Exam. Hx of MIS, HxMM, HxAKs.   The patient presents for Total-Body Skin Exam (TBSE) for skin cancer screening and mole check. The patient has spots, moles and lesions to be evaluated, some may be new or changing and the patient may have concern these could be cancer.  The following portions of the chart were reviewed this encounter and updated as appropriate: medications, allergies, medical history  Review of Systems:  No other skin or systemic complaints except as noted in HPI or Assessment and Plan.  Objective  Well appearing patient in no apparent distress; mood and affect are within normal limits.  A full examination was performed including scalp, head, eyes, ears, nose, lips, neck, chest, axillae, abdomen, back, buttocks, bilateral upper extremities, bilateral lower extremities, hands, feet, fingers, toes, fingernails, and toenails. All findings within normal limits unless otherwise noted below.   Relevant physical exam findings are noted in the Assessment and Plan.      Left Superior Forehead at hair line 1.1 cm flesh colored papule  Right lateral epigastric costal area 0.5 cm dark brown irregular macule  R of midine epigastric 0.6 cm dark brown irregular macule  LLQ abdomen periumbilical 0.5 cm dark brown irregular macule  LLQ abdomen lateral to mid line 0.5 cm dark brown irregular macule  Left Posterior Base of Neck 0.6 cm dark brown irregular macule  Mid Back spinal 0.6 cm dark brown irregular macule  Right Mid Back Lateral at side superior 0.5 cm dark brown irregular macule  Right Mid Back Lateral at side inferior 0.6 cm dark brown irregular macule  Right Low Back Lateral 0.7 cm dark brown irregular macule   Assessment & Plan   SKIN CANCER SCREENING PERFORMED TODAY.  ACTINIC DAMAGE - Chronic  condition, secondary to cumulative UV/sun exposure - diffuse scaly erythematous macules with underlying dyspigmentation - Recommend daily broad spectrum sunscreen SPF 30+ to sun-exposed areas, reapply every 2 hours as needed.  - Staying in the shade or wearing long sleeves, sun glasses (UVA+UVB protection) and wide brim hats (4-inch brim around the entire circumference of the hat) are also recommended for sun protection.  - Call for new or changing lesions.  LENTIGINES, SEBORRHEIC KERATOSES, HEMANGIOMAS - Benign normal skin lesions - Benign-appearing - Call for any changes  MELANOCYTIC NEVI - R middle to distal pretibial 0.4 x 0.3 cm regular brown macule.  - 0.5 x 0.4 cm R prox lat forearm regular brown macule. - 0.5 cm Flesh colored regular papule at right inferior  chin. - Tan-brown and/or pink-flesh-colored symmetric macules and papules - Benign appearing on exam today - Observation - Call clinic for new or changing moles - Recommend daily use of broad spectrum spf 30+ sunscreen to sun-exposed areas.  - Check nails when remove polish.   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 - Recommend regular full body skin exams - Recommend daily broad spectrum sunscreen SPF 30+ to sun-exposed  areas, reapply every 2 hours as needed.  - Call if any new or changing lesions are noted between office visits  HISTORY OF DYSPLASTIC NEVI - multiple, see problem list No evidence of recurrence today Recommend regular full body skin exams Recommend daily broad spectrum sunscreen SPF 30+ to  sun-exposed areas, reapply every 2 hours as needed.  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 Call for changes.     NEOPLASM OF SKIN (10) Left Superior Forehead at hair line Epidermal / dermal shaving  Lesion diameter (cm):  1.1 Informed consent: discussed and consent obtained   Timeout: patient name, date of birth, surgical site, and procedure verified   Procedure prep:  Patient was prepped and draped in usual sterile fashion Prep type:  Isopropyl alcohol Anesthesia: the lesion was anesthetized in a standard fashion   Anesthetic:  1% lidocaine  w/ epinephrine 1-100,000 buffered w/ 8.4% NaHCO3 Instrument used: flexible razor blade   Hemostasis achieved with: pressure, aluminum chloride and electrodesiccation   Outcome: patient tolerated procedure well   Post-procedure details: sterile dressing applied and wound care instructions given   Dressing type: bandage and petrolatum    Specimen 1 - Surgical pathology Differential Diagnosis: Nevus, R/O dysplasia vs skin cancer  Check Margins: Yes Right lateral epigastric costal area Epidermal / dermal shaving  Lesion diameter (cm):  0.5 Informed consent: discussed and consent obtained   Timeout: patient name, date of birth, surgical site, and procedure verified   Procedure prep:  Patient was prepped and draped in usual sterile fashion Prep type:  Isopropyl alcohol Anesthesia: the lesion was anesthetized in a standard fashion   Anesthetic:  1% lidocaine  w/ epinephrine 1-100,000 buffered w/ 8.4% NaHCO3 Instrument used: flexible razor blade   Hemostasis achieved with: pressure, aluminum chloride and electrodesiccation   Outcome: patient tolerated procedure well   Post-procedure details: sterile dressing applied and wound care instructions given   Dressing type: bandage and petrolatum    Specimen 2 - Surgical  pathology Differential Diagnosis: Nevus, R/O dysplasia vs skin cancer  Check Margins: Yes R of midine epigastric Epidermal / dermal shaving  Lesion diameter (cm):  0.6 Informed consent: discussed and consent obtained   Timeout: patient name, date of birth, surgical site, and procedure verified   Procedure prep:  Patient was prepped and draped in usual sterile fashion Prep type:  Isopropyl alcohol Anesthesia: the lesion was anesthetized in a standard fashion   Anesthetic:  1% lidocaine  w/ epinephrine 1-100,000 buffered w/ 8.4% NaHCO3 Instrument used: flexible razor blade   Hemostasis achieved with: pressure, aluminum chloride and electrodesiccation   Outcome: patient tolerated procedure well   Post-procedure details: sterile dressing applied and wound care instructions given   Dressing type: bandage and petrolatum    Specimen 3 - Surgical pathology Differential Diagnosis: Nevus, R/O dysplasia vs skin cancer  Check Margins: Yes LLQ abdomen periumbilical Epidermal / dermal shaving  Lesion diameter (cm):  0.5 Informed consent: discussed and consent obtained   Timeout: patient name, date of birth, surgical site, and procedure verified   Procedure prep:  Patient was prepped and draped in usual sterile fashion Prep type:  Isopropyl alcohol Anesthesia: the lesion was anesthetized in a standard fashion   Anesthetic:  1% lidocaine  w/ epinephrine 1-100,000 buffered w/ 8.4% NaHCO3 Instrument used: flexible razor blade   Hemostasis achieved with: pressure, aluminum chloride and electrodesiccation   Outcome:  patient tolerated procedure well   Post-procedure details: sterile dressing applied and wound care instructions given   Dressing type: bandage and petrolatum    Specimen 4 - Surgical pathology Differential Diagnosis: Nevus, R/O dysplasia vs skin cancer  Check Margins: Yes LLQ abdomen lateral to mid line Epidermal / dermal shaving  Lesion diameter (cm):  0.5 Informed consent:  discussed and consent obtained   Timeout: patient name, date of birth, surgical site, and procedure verified   Procedure prep:  Patient was prepped and draped in usual sterile fashion Prep type:  Isopropyl alcohol Anesthesia: the lesion was anesthetized in a standard fashion   Anesthetic:  1% lidocaine  w/ epinephrine 1-100,000 buffered w/ 8.4% NaHCO3 Instrument used: flexible razor blade   Hemostasis achieved with: pressure, aluminum chloride and electrodesiccation   Outcome: patient tolerated procedure well   Post-procedure details: sterile dressing applied and wound care instructions given   Dressing type: bandage and petrolatum    Specimen 5 - Surgical pathology Differential Diagnosis: Nevus, R/O dysplasia vs skin cancer  Check Margins: Yes Left Posterior Base of Neck Epidermal / dermal shaving  Lesion diameter (cm):  0.6 Informed consent: discussed and consent obtained   Timeout: patient name, date of birth, surgical site, and procedure verified   Procedure prep:  Patient was prepped and draped in usual sterile fashion Prep type:  Isopropyl alcohol Anesthesia: the lesion was anesthetized in a standard fashion   Anesthetic:  1% lidocaine  w/ epinephrine 1-100,000 buffered w/ 8.4% NaHCO3 Instrument used: flexible razor blade   Hemostasis achieved with: pressure, aluminum chloride and electrodesiccation   Outcome: patient tolerated procedure well   Post-procedure details: sterile dressing applied and wound care instructions given   Dressing type: bandage and petrolatum    Specimen 6 - Surgical pathology Differential Diagnosis: Nevus, R/O dysplasia vs skin cancer  Check Margins: Yes Mid Back spinal Epidermal / dermal shaving  Lesion diameter (cm):  0.6 Informed consent: discussed and consent obtained   Timeout: patient name, date of birth, surgical site, and procedure verified   Procedure prep:  Patient was prepped and draped in usual sterile fashion Prep type:  Isopropyl  alcohol Anesthesia: the lesion was anesthetized in a standard fashion   Anesthetic:  1% lidocaine  w/ epinephrine 1-100,000 buffered w/ 8.4% NaHCO3 Instrument used: flexible razor blade   Hemostasis achieved with: pressure, aluminum chloride and electrodesiccation   Outcome: patient tolerated procedure well   Post-procedure details: sterile dressing applied and wound care instructions given   Dressing type: bandage and petrolatum    Specimen 7 - Surgical pathology Differential Diagnosis: Nevus, R/O dysplasia vs skin cancer  Check Margins: Yes Right Mid Back Lateral at side superior Epidermal / dermal shaving  Lesion diameter (cm):  0.5 Informed consent: discussed and consent obtained   Timeout: patient name, date of birth, surgical site, and procedure verified   Procedure prep:  Patient was prepped and draped in usual sterile fashion Prep type:  Isopropyl alcohol Anesthesia: the lesion was anesthetized in a standard fashion   Anesthetic:  1% lidocaine  w/ epinephrine 1-100,000 buffered w/ 8.4% NaHCO3 Instrument used: flexible razor blade   Hemostasis achieved with: pressure, aluminum chloride and electrodesiccation   Outcome: patient tolerated procedure well   Post-procedure details: sterile dressing applied and wound care instructions given   Dressing type: bandage and petrolatum    Specimen 8 - Surgical pathology Differential Diagnosis: Nevus, R/O dysplasia vs skin cancer  Check Margins: Yes Right Mid Back Lateral at side inferior Epidermal / dermal  shaving  Lesion diameter (cm):  0.6 Informed consent: discussed and consent obtained   Timeout: patient name, date of birth, surgical site, and procedure verified   Procedure prep:  Patient was prepped and draped in usual sterile fashion Prep type:  Isopropyl alcohol Anesthesia: the lesion was anesthetized in a standard fashion   Anesthetic:  1% lidocaine  w/ epinephrine 1-100,000 buffered w/ 8.4% NaHCO3 Instrument used: flexible  razor blade   Hemostasis achieved with: pressure, aluminum chloride and electrodesiccation   Outcome: patient tolerated procedure well   Post-procedure details: sterile dressing applied and wound care instructions given   Dressing type: bandage and petrolatum    Specimen 9 - Surgical pathology Differential Diagnosis: Nevus, R/O dysplasia vs skin cancer  Check Margins: Yes Right Low Back Lateral Epidermal / dermal shaving  Lesion diameter (cm):  0.7 Informed consent: discussed and consent obtained   Timeout: patient name, date of birth, surgical site, and procedure verified   Procedure prep:  Patient was prepped and draped in usual sterile fashion Prep type:  Isopropyl alcohol Anesthesia: the lesion was anesthetized in a standard fashion   Anesthetic:  1% lidocaine  w/ epinephrine 1-100,000 buffered w/ 8.4% NaHCO3 Instrument used: flexible razor blade   Hemostasis achieved with: pressure, aluminum chloride and electrodesiccation   Outcome: patient tolerated procedure well   Post-procedure details: sterile dressing applied and wound care instructions given   Dressing type: bandage and petrolatum    Specimen 10 - Surgical pathology Differential Diagnosis: Nevus, R/O dysplasia vs skin cancer  Check Margins: Yes  Return in about 3 months (around 07/11/2024) for TBSE, HxMM, HxMIS, HxDN.  I, Zekiel Torian, CMA, am acting as scribe for Alm Rhyme, MD.   Documentation: I have reviewed the above documentation for accuracy and completeness, and I agree with the above.  Alm Rhyme, MD

## 2024-04-12 NOTE — Patient Instructions (Addendum)

## 2024-04-17 ENCOUNTER — Ambulatory Visit: Payer: Self-pay | Admitting: Dermatology

## 2024-04-17 LAB — SURGICAL PATHOLOGY

## 2024-04-18 ENCOUNTER — Encounter: Payer: Self-pay | Admitting: Dermatology

## 2024-04-26 ENCOUNTER — Encounter: Payer: Self-pay | Admitting: Family Medicine

## 2024-04-26 DIAGNOSIS — Z03818 Encounter for observation for suspected exposure to other biological agents ruled out: Secondary | ICD-10-CM | POA: Diagnosis not present

## 2024-04-26 DIAGNOSIS — J029 Acute pharyngitis, unspecified: Secondary | ICD-10-CM | POA: Diagnosis not present

## 2024-04-26 DIAGNOSIS — J209 Acute bronchitis, unspecified: Secondary | ICD-10-CM | POA: Diagnosis not present

## 2024-04-26 DIAGNOSIS — B9689 Other specified bacterial agents as the cause of diseases classified elsewhere: Secondary | ICD-10-CM | POA: Diagnosis not present

## 2024-04-26 DIAGNOSIS — J019 Acute sinusitis, unspecified: Secondary | ICD-10-CM | POA: Diagnosis not present

## 2024-06-16 ENCOUNTER — Other Ambulatory Visit: Payer: Self-pay | Admitting: Family Medicine

## 2024-06-16 DIAGNOSIS — N6342 Unspecified lump in left breast, subareolar: Secondary | ICD-10-CM

## 2024-06-23 ENCOUNTER — Other Ambulatory Visit: Payer: Self-pay

## 2024-07-17 ENCOUNTER — Ambulatory Visit: Admitting: Dermatology
